# Patient Record
Sex: Female | Born: 1960 | Race: Black or African American | Hispanic: No | Marital: Single | Smoking: Never smoker
Health system: Southern US, Community
[De-identification: ages and names within clinical notes are randomized; demographics above are authoritative.]

## PROBLEM LIST (undated history)

## (undated) DIAGNOSIS — J45909 Unspecified asthma, uncomplicated: Secondary | ICD-10-CM

## (undated) DIAGNOSIS — E079 Disorder of thyroid, unspecified: Secondary | ICD-10-CM

## (undated) DIAGNOSIS — I10 Essential (primary) hypertension: Secondary | ICD-10-CM

## (undated) HISTORY — PX: NECK SURGERY: SHX720

## (undated) HISTORY — PX: BACK SURGERY: SHX140

## (undated) HISTORY — PX: CHOLECYSTECTOMY: SHX55

---

## 2004-07-05 ENCOUNTER — Emergency Department: Payer: Self-pay | Admitting: Emergency Medicine

## 2005-02-07 ENCOUNTER — Emergency Department: Payer: Self-pay | Admitting: Emergency Medicine

## 2005-05-01 ENCOUNTER — Emergency Department (HOSPITAL_COMMUNITY): Admission: EM | Admit: 2005-05-01 | Discharge: 2005-05-01 | Payer: Self-pay | Admitting: Emergency Medicine

## 2005-06-11 ENCOUNTER — Emergency Department (HOSPITAL_COMMUNITY): Admission: EM | Admit: 2005-06-11 | Discharge: 2005-06-11 | Payer: Self-pay | Admitting: Emergency Medicine

## 2006-07-08 ENCOUNTER — Observation Stay: Payer: Self-pay | Admitting: Certified Nurse Midwife

## 2006-07-16 ENCOUNTER — Observation Stay: Payer: Self-pay | Admitting: Obstetrics and Gynecology

## 2006-07-18 ENCOUNTER — Observation Stay: Payer: Self-pay | Admitting: Obstetrics and Gynecology

## 2006-07-25 ENCOUNTER — Observation Stay: Payer: Self-pay | Admitting: Certified Nurse Midwife

## 2006-08-02 ENCOUNTER — Inpatient Hospital Stay: Payer: Self-pay | Admitting: Obstetrics and Gynecology

## 2006-08-24 ENCOUNTER — Emergency Department: Payer: Self-pay | Admitting: Emergency Medicine

## 2006-11-03 ENCOUNTER — Ambulatory Visit: Payer: Self-pay | Admitting: Obstetrics and Gynecology

## 2006-11-11 ENCOUNTER — Ambulatory Visit: Payer: Self-pay | Admitting: Obstetrics and Gynecology

## 2007-01-04 ENCOUNTER — Emergency Department: Payer: Self-pay | Admitting: Emergency Medicine

## 2007-01-04 ENCOUNTER — Other Ambulatory Visit: Payer: Self-pay

## 2007-09-09 ENCOUNTER — Emergency Department: Payer: Self-pay | Admitting: Emergency Medicine

## 2008-03-08 ENCOUNTER — Emergency Department: Payer: Self-pay | Admitting: Emergency Medicine

## 2008-06-10 ENCOUNTER — Emergency Department: Payer: Self-pay | Admitting: Emergency Medicine

## 2008-10-20 ENCOUNTER — Emergency Department: Payer: Self-pay | Admitting: Unknown Physician Specialty

## 2009-07-08 ENCOUNTER — Emergency Department: Payer: Self-pay | Admitting: Emergency Medicine

## 2009-08-06 ENCOUNTER — Ambulatory Visit: Payer: Self-pay | Admitting: Orthopedic Surgery

## 2009-09-09 ENCOUNTER — Ambulatory Visit: Payer: Self-pay | Admitting: Family Medicine

## 2009-09-15 ENCOUNTER — Emergency Department: Payer: Self-pay | Admitting: Emergency Medicine

## 2009-11-09 ENCOUNTER — Emergency Department: Payer: Self-pay | Admitting: Internal Medicine

## 2010-06-07 ENCOUNTER — Emergency Department: Payer: Self-pay | Admitting: Emergency Medicine

## 2010-12-24 ENCOUNTER — Emergency Department: Payer: Self-pay | Admitting: Emergency Medicine

## 2011-05-23 ENCOUNTER — Emergency Department: Payer: Self-pay | Admitting: Emergency Medicine

## 2011-09-12 ENCOUNTER — Emergency Department: Payer: Self-pay | Admitting: *Deleted

## 2011-12-27 ENCOUNTER — Emergency Department: Payer: Self-pay | Admitting: Unknown Physician Specialty

## 2011-12-27 LAB — COMPREHENSIVE METABOLIC PANEL
Alkaline Phosphatase: 98 U/L (ref 50–136)
Bilirubin,Total: 0.2 mg/dL (ref 0.2–1.0)
Chloride: 104 mmol/L (ref 98–107)
Co2: 26 mmol/L (ref 21–32)
EGFR (Non-African Amer.): >60
Potassium: 4 mmol/L (ref 3.5–5.1)
SGOT(AST): 18 U/L (ref 15–37)
SGPT (ALT): 34 U/L
Sodium: 139 mmol/L (ref 136–145)
Total Protein: 8.3 g/dL — ABNORMAL HIGH (ref 6.4–8.2)

## 2011-12-27 LAB — CBC
HGB: 12.5 g/dL (ref 12.0–16.0)
MCHC: 32.4 g/dL (ref 32.0–36.0)
Platelet: 175 10*3/uL (ref 150–440)

## 2012-03-07 ENCOUNTER — Emergency Department: Payer: Self-pay | Admitting: Emergency Medicine

## 2012-05-17 ENCOUNTER — Ambulatory Visit: Payer: Self-pay | Admitting: Pain Medicine

## 2012-06-02 ENCOUNTER — Ambulatory Visit: Payer: Self-pay | Admitting: Pain Medicine

## 2013-02-22 ENCOUNTER — Emergency Department: Payer: Self-pay | Admitting: Emergency Medicine

## 2013-02-22 LAB — COMPREHENSIVE METABOLIC PANEL
Alkaline Phosphatase: 94 U/L (ref 50–136)
Anion Gap: 5 — ABNORMAL LOW (ref 7–16)
BUN: 13 mg/dL (ref 7–18)
Calcium, Total: 9.2 mg/dL (ref 8.5–10.1)
Chloride: 104 mmol/L (ref 98–107)
Co2: 30 mmol/L (ref 21–32)
Creatinine: 0.79 mg/dL (ref 0.60–1.30)
EGFR (African American): >60
Glucose: 97 mg/dL (ref 65–99)
Potassium: 3.7 mmol/L (ref 3.5–5.1)
SGOT(AST): 25 U/L (ref 15–37)
Total Protein: 7.4 g/dL (ref 6.4–8.2)

## 2013-02-22 LAB — CBC WITH DIFFERENTIAL/PLATELET
Basophil #: 0.1 10*3/uL (ref 0.0–0.1)
Basophil %: 0.8 %
Eosinophil #: 0.2 10*3/uL (ref 0.0–0.7)
Eosinophil %: 1.9 %
HCT: 38 % (ref 35.0–47.0)
Lymphocyte #: 2.4 10*3/uL (ref 1.0–3.6)
Lymphocyte %: 27.2 %
MCH: 30.6 pg (ref 26.0–34.0)
Monocyte #: 0.6 x10 3/mm (ref 0.2–0.9)
Monocyte %: 6.6 %
Neutrophil #: 5.7 10*3/uL (ref 1.4–6.5)
Neutrophil %: 63.5 %
RBC: 4.28 10*6/uL (ref 3.80–5.20)
RDW: 14.4 % (ref 11.5–14.5)

## 2013-02-22 LAB — URINALYSIS, COMPLETE
Blood: NEGATIVE
Glucose,UR: NEGATIVE mg/dL (ref 0–75)
Ketone: NEGATIVE
Nitrite: NEGATIVE
Ph: 6 (ref 4.5–8.0)
Protein: 30
Specific Gravity: 1.016 (ref 1.003–1.030)
Squamous Epithelial: 2
WBC UR: 1 /HPF (ref 0–5)

## 2013-08-04 ENCOUNTER — Emergency Department: Payer: Self-pay | Admitting: Emergency Medicine

## 2013-08-17 ENCOUNTER — Ambulatory Visit: Payer: Self-pay | Admitting: Family Medicine

## 2013-09-05 ENCOUNTER — Emergency Department: Payer: Self-pay | Admitting: Emergency Medicine

## 2014-03-12 ENCOUNTER — Encounter: Payer: Self-pay | Admitting: Neurosurgery

## 2014-03-26 ENCOUNTER — Encounter: Payer: Self-pay | Admitting: Neurosurgery

## 2014-03-31 ENCOUNTER — Emergency Department: Payer: Self-pay | Admitting: Emergency Medicine

## 2014-03-31 LAB — CBC
HCT: 38.7 % (ref 35.0–47.0)
HGB: 12.7 g/dL (ref 12.0–16.0)
MCH: 29.8 pg (ref 26.0–34.0)
MCHC: 32.9 g/dL (ref 32.0–36.0)
MCV: 91 fL (ref 80–100)
PLATELETS: 176 10*3/uL (ref 150–440)
RBC: 4.26 10*6/uL (ref 3.80–5.20)
RDW: 14.1 % (ref 11.5–14.5)
WBC: 7 10*3/uL (ref 3.6–11.0)

## 2014-03-31 LAB — BASIC METABOLIC PANEL
Anion Gap: 6 — ABNORMAL LOW (ref 7–16)
BUN: 14 mg/dL (ref 7–18)
CHLORIDE: 102 mmol/L (ref 98–107)
CO2: 32 mmol/L (ref 21–32)
CREATININE: 0.89 mg/dL (ref 0.60–1.30)
Calcium, Total: 8.8 mg/dL (ref 8.5–10.1)
EGFR (African American): >60
EGFR (Non-African Amer.): >60
Glucose: 101 mg/dL — ABNORMAL HIGH (ref 65–99)
Osmolality: 280 (ref 275–301)
Potassium: 3.6 mmol/L (ref 3.5–5.1)
SODIUM: 140 mmol/L (ref 136–145)

## 2014-03-31 LAB — TROPONIN I: Troponin-I: <0.02 ng/mL

## 2014-04-01 LAB — TROPONIN I: Troponin-I: <0.02 ng/mL

## 2014-09-27 ENCOUNTER — Emergency Department: Payer: Self-pay | Admitting: Emergency Medicine

## 2014-10-03 ENCOUNTER — Emergency Department: Payer: Self-pay | Admitting: Emergency Medicine

## 2015-09-23 ENCOUNTER — Encounter: Payer: Self-pay | Admitting: Medical Oncology

## 2015-09-23 ENCOUNTER — Emergency Department
Admission: EM | Admit: 2015-09-23 | Discharge: 2015-09-23 | Disposition: A | Discharge status: Home / Self Care | Payer: Medicare HMO | Attending: Emergency Medicine | Admitting: Emergency Medicine

## 2015-09-23 ENCOUNTER — Emergency Department: Payer: Medicare HMO

## 2015-09-23 DIAGNOSIS — Z79899 Other long term (current) drug therapy: Secondary | ICD-10-CM | POA: Insufficient documentation

## 2015-09-23 DIAGNOSIS — I10 Essential (primary) hypertension: Secondary | ICD-10-CM | POA: Diagnosis not present

## 2015-09-23 DIAGNOSIS — N644 Mastodynia: Secondary | ICD-10-CM

## 2015-09-23 DIAGNOSIS — R200 Anesthesia of skin: Secondary | ICD-10-CM | POA: Diagnosis not present

## 2015-09-23 DIAGNOSIS — M25519 Pain in unspecified shoulder: Secondary | ICD-10-CM | POA: Insufficient documentation

## 2015-09-23 DIAGNOSIS — M79602 Pain in left arm: Secondary | ICD-10-CM | POA: Diagnosis present

## 2015-09-23 HISTORY — DX: Essential (primary) hypertension: I10

## 2015-09-23 LAB — COMPREHENSIVE METABOLIC PANEL
ALBUMIN: 4 g/dL (ref 3.5–5.0)
ALT: 23 U/L (ref 14–54)
ANION GAP: 7 (ref 5–15)
AST: 22 U/L (ref 15–41)
Alkaline Phosphatase: 76 U/L (ref 38–126)
BILIRUBIN TOTAL: 0.3 mg/dL (ref 0.3–1.2)
BUN: 13 mg/dL (ref 6–20)
CHLORIDE: 105 mmol/L (ref 101–111)
CO2: 27 mmol/L (ref 22–32)
Calcium: 9.4 mg/dL (ref 8.9–10.3)
Creatinine, Ser: 0.83 mg/dL (ref 0.44–1.00)
GFR calc Af Amer: >60 mL/min (ref >60)
GFR calc non Af Amer: >60 mL/min (ref >60)
GLUCOSE: 105 mg/dL — AB (ref 65–99)
POTASSIUM: 4.2 mmol/L (ref 3.5–5.1)
SODIUM: 139 mmol/L (ref 135–145)
TOTAL PROTEIN: 7.7 g/dL (ref 6.5–8.1)

## 2015-09-23 LAB — CBC WITH DIFFERENTIAL/PLATELET
BASOS ABS: 0.1 10*3/uL (ref 0–0.1)
BASOS PCT: 1 %
Eosinophils Absolute: 0.1 10*3/uL (ref 0–0.7)
Eosinophils Relative: 2 %
HEMATOCRIT: 36.2 % (ref 35.0–47.0)
HEMOGLOBIN: 12 g/dL (ref 12.0–16.0)
LYMPHS PCT: 29 %
Lymphs Abs: 1.9 10*3/uL (ref 1.0–3.6)
MCH: 29.1 pg (ref 26.0–34.0)
MCHC: 33 g/dL (ref 32.0–36.0)
MCV: 88.1 fL (ref 80.0–100.0)
MONOS PCT: 7 %
Monocytes Absolute: 0.5 10*3/uL (ref 0.2–0.9)
NEUTROS ABS: 4.1 10*3/uL (ref 1.4–6.5)
NEUTROS PCT: 61 %
Platelets: 186 10*3/uL (ref 150–440)
RBC: 4.12 MIL/uL (ref 3.80–5.20)
RDW: 14.4 % (ref 11.5–14.5)
WBC: 6.7 10*3/uL (ref 3.6–11.0)

## 2015-09-23 LAB — TROPONIN I: Troponin I: <0.03 ng/mL (ref <0.031)

## 2015-09-23 LAB — FIBRIN DERIVATIVES D-DIMER (ARMC ONLY): Fibrin derivatives D-dimer (ARMC): 656 — ABNORMAL HIGH (ref 0–499)

## 2015-09-23 MED ORDER — IOHEXOL 350 MG/ML SOLN
100.0000 mL | Freq: Once | INTRAVENOUS | Status: AC | PRN
  Administered 2015-09-23: 100 mL via INTRAVENOUS

## 2015-09-23 NOTE — ED Notes (Signed)
Pt returned from ct scan

## 2015-09-23 NOTE — ED Notes (Signed)
Pt reports that she started Sunday with left arm pain and numbness, since then pt reports she has began to feel like her heart is racing and that she is sob. Pt also reports pain and a "knot" feeling to left breast since Sunday, painful to touch.

## 2015-09-23 NOTE — ED Provider Notes (Signed)
[facility] Emergency Department Provider Note  ____________________________________________  Time seen: Approximately 1:16 PM  I have reviewed the triage vital signs and the nursing notes.   HISTORY  Chief Complaint Arm Pain; Chest Pain; and Numbness    HPI Elizabeth [name] is a 55 y.o. female patient reports she's had pain in her left arm and numbness radiating from the neck down along the outside of the left arm past the elbow toward the hand in a stripe-like area since about Sunday. It's been numb and tingly in that area. Patient also has been having pain in her shoulder since approximately that time as well. Hurts to move her shoulder. Patient has also been having her heart race off and on especially this morning with some shortness of breath. She's been having some pain in her left side of her chest especially in the left breast area to palpation and with movement of her arm. Patient has not had problems like this before. 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 Patient reports the pain is moderate to severe. There is fairly sharp and achy.   Past Medical History  Diagnosis Date  . Hypertension     There are no active problems to display for this patient.   Past Surgical History  Procedure Laterality Date  . Back surgery    . Neck surgery    . Cholecystectomy      Current Outpatient Rx  Name  Route  Sig  Dispense  Refill  . albuterol (VENTOLIN HFA) 108 (90 Base) MCG/ACT inhaler   Inhalation   Inhale 1-2 puffs into the lungs every 6 (six) hours as needed.         Marland Kitchen amLODipine (NORVASC) 10 MG tablet   Oral   Take 1 tablet by mouth daily.         Marland Kitchen atenolol (TENORMIN) 25 MG tablet   Oral   Take 1 tablet by mouth daily.         . Cholecalciferol (D 2000) 2000 units TABS   Oral   Take 1 tablet by mouth daily.         . fluticasone (FLONASE) 50 MCG/ACT  nasal spray   Each Nare   Place 2 sprays into both nostrils as needed.         . hydrochlorothiazide (HYDRODIURIL) 25 MG tablet   Oral   Take 1 tablet by mouth daily.         Marland Kitchen losartan (COZAAR) 50 MG tablet   Oral   Take 50 mg by mouth daily.         . ranitidine (ZANTAC) 150 MG tablet   Oral   Take 150 mg by mouth as needed for heartburn.           Allergies Lisinopril  No family history on file.  Social History Social History  Substance Use Topics  . Smoking status: Never Smoker   . Smokeless tobacco: None  . Alcohol Use: None    Review of Systems Constitutional: No fever/chills Eyes: No visual changes. ENT: No sore throat. Cardiovascular: See history of present illness Respiratory: See history of present illness Gastrointestinal: No abdominal pain.  No nausea, no vomiting.  No diarrhea.  No constipation. Genitourinary: Negative for dysuria. Musculoskeletal: Negative for back pain. Skin: Negative for rash. Neurological: Negative for headaches, focal weakness or numbness.  10-point ROS otherwise negative.  ____________________________________________   PHYSICAL  EXAM:  VITAL SIGNS: ED Triage Vitals  Enc Vitals Group     BP 09/23/15 0923 170/98 mmHg     Pulse Rate 09/23/15 0923 68     Resp 09/23/15 0923 18     Temp 09/23/15 0923 98.1 F (36.7 C)     Temp Source 09/23/15 0923 Oral     SpO2 09/23/15 0923 99 %     Weight 09/23/15 0923 218 lb (98.884 kg)     Height 09/23/15 0923  (1.575 m)     Head Cir --      Peak Flow --      Pain Score 09/23/15 0924 7     Pain Loc --      Pain Edu? --      Excl. in GC? --     Constitutional: Alert and oriented. Well appearing and in no acute distress. Eyes: Conjunctivae are normal. PERRL. EOMI. Head: Atraumatic. Nose: No congestion/rhinnorhea. Mouth/Throat: Mucous membranes are moist.  Oropharynx non-erythematous. Neck: No stridor.   Cardiovascular: Normal rate, regular rhythm. Grossly normal  heart sounds.  Good peripheral circulation. Respiratory: Normal respiratory effort.  No retractions. Lungs CTAB. Patient does have some tenderness especially on palpation of the left breast which was done with the PA student in the room Gastrointestinal: Soft and nontender. No distention. No abdominal bruits. No CVA tenderness. Musculoskeletal: No lower extremity tenderness nor edema.  No joint effusions. Neurologic:  Normal speech and language. Patient has numbness in the lateral part of the arm from above the shoulder down to below the elbow in a dermatomal pattern is no motor weakness. Skin:  Skin is warm, dry and intact. No rash noted. Psychiatric: Mood and affect are normal. Speech and behavior are normal.  ____________________________________________   LABS (all labs ordered are listed, but only abnormal results are displayed)  Labs Reviewed  COMPREHENSIVE METABOLIC PANEL - Abnormal; Notable for the following:    Glucose, Bld 105 (*)    All other components within normal limits  FIBRIN DERIVATIVES D-DIMER ([facility] ONLY) - Abnormal; Notable for the following:    Fibrin derivatives D-dimer ([facility]) 656 (*)    All other components within normal limits  TROPONIN I  CBC WITH DIFFERENTIAL/PLATELET   ____________________________________________  EKG  EKG read and interpreted by me shows normal sinus rhythm rate of 68 normal axis essentially normal EKG ____________________________________________  RADIOLOGY  Chest x-ray read by radiology as no acute disease. Shoulder x-ray read by radiology as calcific tendinitis. C-spine read by radiology as possible loosening of the hardware. Radiology recommends CT of the neck. CT per radiology shows some loosening at C3 and C4. There is also some impingement on the neuroforamen at 2 levels. I discussed this with Vernona Rieger Dr. Sarita Haver [facility] nurse. She will arrange follow-up for the patient. CT of the chest is negative.  ____________________________________________   PROCEDURES    ____________________________________________   INITIAL IMPRESSION / ASSESSMENT AND PLAN / ED COURSE  Pertinent labs & imaging results that were available during my care of the patient were reviewed by me and considered in my medical decision making (see chart for details).  D-dimer is elevated I will do a CT of the chest as well. ____________________________________________   FINAL CLINICAL IMPRESSION(S) / ED DIAGNOSES  Final diagnoses:  Shoulder pain, unspecified laterality  Breast pain      Arnaldo Natal, MD 09/23/15 1732

## 2015-09-23 NOTE — Discharge Instructions (Signed)
I discussed the CT of the neck with Dr. Samuella Cota oh and a [facility] nurse. She says they will call you and arrange follow-up for you. However if they do not call you by Thursday please call the office at [phone] to arrange a follow-up appointment. Please also have your primary care doctor follow-up with your breast pain. I think that a repeat mammogram may be a good idea. You can use Tylenol or Motrin for the pain for the time being. Please return to the emergency room for worse pain or any weakness. Breast Tenderness Breast tenderness is a common problem for women of all ages. Breast tenderness may cause mild discomfort to severe pain. It has a variety of causes. Your health care provider will find out the likely cause of your breast tenderness by examining your breasts, asking you about symptoms, and ordering some tests. Breast tenderness usually does not mean you have breast cancer. HOME CARE INSTRUCTIONS  Breast tenderness often can be handled at home. You can try:  Getting fitted for a new bra that provides more support, especially during exercise.  Wearing a more supportive bra or sports bra while sleeping when your breasts are very tender.  If you have a breast injury, apply ice to the area:  Put ice in a plastic bag.  Place a towel between your skin and the bag.  Leave the ice on for 20 minutes, 2-3 times a day.  If your breasts are too full of milk as a result of breastfeeding, try:  Expressing milk either by hand or with a breast pump.  Applying a warm compress to the breasts for relief.  Taking over-the-counter pain relievers, if approved by your health care provider.  Taking other medicines that your health care provider prescribes. These may include antibiotic medicines or birth control pills. Over the long term, your breast tenderness might be eased if you:  Cut down on caffeine.  Reduce the amount of fat in your diet. Keep a log of the days and times when your breasts are  most tender. This will help you and your health care provider find the cause of the tenderness and how to relieve it. Also, learn how to do breast exams at home. This will help you notice if you have an unusual growth or lump that could cause tenderness. SEEK MEDICAL CARE IF:   Any part of your breast is hard, red, and hot to the touch. This could be a sign of infection.  Fluid is coming out of your nipples (and you are not breastfeeding). Especially watch for blood or pus.  You have a fever as well as breast tenderness.  You have a new or painful lump in your breast that remains after your menstrual period ends.  You have tried to take care of the pain at home, but it has not gone away.  Your breast pain is getting worse, or the pain is making it hard to do the things you usually do during your day.   This information is not intended to replace advice given to you by your health care provider. Make sure you discuss any questions you have with your health care provider.   Document Released: 06/24/2008 Document Revised: 03/14/2013 Document Reviewed: 02/08/2013 Elsevier Interactive Patient Education [employer]

## 2016-06-22 ENCOUNTER — Other Ambulatory Visit: Payer: Self-pay | Admitting: Obstetrics and Gynecology

## 2016-06-22 DIAGNOSIS — I739 Peripheral vascular disease, unspecified: Secondary | ICD-10-CM

## 2016-06-25 ENCOUNTER — Ambulatory Visit
Admission: RE | Admit: 2016-06-25 | Discharge: 2016-06-25 | Disposition: A | Discharge status: Home / Self Care | Payer: Medicare HMO | Source: Ambulatory Visit | Attending: Obstetrics and Gynecology | Admitting: Obstetrics and Gynecology

## 2016-06-25 DIAGNOSIS — I739 Peripheral vascular disease, unspecified: Secondary | ICD-10-CM | POA: Insufficient documentation

## 2016-07-13 ENCOUNTER — Ambulatory Visit (INDEPENDENT_AMBULATORY_CARE_PROVIDER_SITE_OTHER): Payer: Medicare HMO | Admitting: Vascular Surgery

## 2016-07-13 ENCOUNTER — Encounter (INDEPENDENT_AMBULATORY_CARE_PROVIDER_SITE_OTHER): Payer: Self-pay | Admitting: Vascular Surgery

## 2016-07-13 VITALS — BP 150/93 | HR 67 | Resp 16 | Ht 62.0 in | Wt 217.0 lb

## 2016-07-13 DIAGNOSIS — M79604 Pain in right leg: Secondary | ICD-10-CM

## 2016-07-13 DIAGNOSIS — M5440 Lumbago with sciatica, unspecified side: Secondary | ICD-10-CM

## 2016-07-13 DIAGNOSIS — M79609 Pain in unspecified limb: Secondary | ICD-10-CM | POA: Insufficient documentation

## 2016-07-13 DIAGNOSIS — I10 Essential (primary) hypertension: Secondary | ICD-10-CM

## 2016-07-13 DIAGNOSIS — M79605 Pain in left leg: Secondary | ICD-10-CM | POA: Diagnosis not present

## 2016-07-13 DIAGNOSIS — M545 Low back pain, unspecified: Secondary | ICD-10-CM | POA: Insufficient documentation

## 2016-07-13 NOTE — Assessment & Plan Note (Signed)
The patient had lower extremity pain that was not entirely clear in its etiology. Given the character of the pain, arterial insufficiency was very reasonable to be considered. She had noninvasive studies which demonstrated no significant lower extremity arterial stenosis bilaterally with only scant atherosclerotic plaque present. It does not appear as if this is the cause of her lower extremity symptoms. Neurogenic claudication is more likely the cause. No further evaluation or workup from a vascular perspective is warranted at this time. I will see the patient back on an as-needed basis.

## 2016-07-13 NOTE — Progress Notes (Signed)
Patient ID: Elizabeth [name], female   DOB: 1961-01-08, 55 y.o.   MRN: [id number]  Chief Complaint  Patient presents with  . New Patient (Initial Visit)    HPI Elizabeth [name] is a 55 y.o. female.  I am asked to see the patient by Dr. Karleen Hampshire for evaluation of PAD as a possible cause of leg pain.  The patient reports lower extremity pain and weakness with activity. This has been going on for months without a clear inciting event or causative factor. She reports no trauma or injury or clear cause of the symptoms. She denies fever or chills. She had a procedure on her back but this did not help her lower extremity symptoms much. both lower extremities are affected.  She had noninvasive studies which demonstrated no significant lower extremity arterial stenosis bilaterally with only scant atherosclerotic plaque present   Past Medical History:  Diagnosis Date  . Hypertension     Past Surgical History:  Procedure Laterality Date  . BACK SURGERY    . CHOLECYSTECTOMY    . NECK SURGERY      Family History  Problem Relation Age of Onset  . Congestive Heart Failure Mother   . Diabetes Mother   . Heart attack Mother   . Hypertension Mother   . Stroke Mother   . Emphysema Father   . COPD Father   . Hypertension Father     Social History Social History  Substance Use Topics  . Smoking status: Never Smoker  . Smokeless tobacco: Never Used  . Alcohol use No    Allergies  Allergen Reactions  . Lisinopril     Current Outpatient Prescriptions  Medication Sig Dispense Refill  . albuterol (VENTOLIN HFA) 108 (90 Base) MCG/ACT inhaler Inhale 1-2 puffs into the lungs every 6 (six) hours as needed.    Marland Kitchen atenolol (TENORMIN) 25 MG tablet Take 1 tablet by mouth daily.    . cetirizine (ZYRTEC) 5 MG tablet Take 5 mg by mouth daily.    . Cholecalciferol (D 2000) 2000 units TABS Take 1 tablet by mouth daily.    . fluticasone (FLONASE) 50 MCG/ACT nasal spray Place 2 sprays into both nostrils  as needed.    . hydrochlorothiazide (HYDRODIURIL) 25 MG tablet Take 1 tablet by mouth daily.    Marland Kitchen losartan (COZAAR) 50 MG tablet Take 50 mg by mouth daily.    . ranitidine (ZANTAC) 150 MG tablet Take 150 mg by mouth as needed for heartburn.    Marland Kitchen amLODipine (NORVASC) 10 MG tablet Take 1 tablet by mouth daily.     No current facility-administered medications for this visit.       REVIEW OF SYSTEMS (Negative unless checked)  Constitutional: [] Weight loss  [] Fever  [] Chills Cardiac: [] Chest pain   [] Chest pressure   [] Palpitations   [] Shortness of breath when laying flat   [] Shortness of breath at rest   [] Shortness of breath with exertion. Vascular:  [x] Pain in legs with walking   [] Pain in legs at rest   [] Pain in legs when laying flat   [x] Claudication   [] Pain in feet when walking  [] Pain in feet at rest  [] Pain in feet when laying flat   [] History of DVT   [] Phlebitis   [] Swelling in legs   [] Varicose veins   [] Non-healing ulcers Pulmonary:   [] Uses home oxygen   [] Productive cough   [] Hemoptysis   [] Wheeze  [] COPD   [] Asthma Neurologic:  [] Dizziness  [] Blackouts   []   Seizures   [] History of stroke   [] History of TIA  [] Aphasia   [] Temporary blindness   [] Dysphagia   [] Weakness or numbness in arms   [] Weakness or numbness in legs Musculoskeletal:  [] Arthritis   [] Joint swelling   [] Joint pain   [x] Low back pain Hematologic:  [] Easy bruising  [] Easy bleeding   [] Hypercoagulable state   [] Anemic  [] Hepatitis Gastrointestinal:  [] Blood in stool   [] Vomiting blood  [] Gastroesophageal reflux/heartburn   [] Abdominal pain Genitourinary:  [] Chronic kidney disease   [] Difficult urination  [] Frequent urination  [] Burning with urination   [] Hematuria Skin:  [] Rashes   [] Ulcers   [] Wounds Psychological:  [] History of anxiety   []  History of major depression.    Physical Exam BP (!) 150/93   Pulse 67   Resp 16   Ht 5\' 2"  (1.575 m)   Wt 217 lb (98.4 kg)   BMI 39.69 kg/m  Gen:  WD/WN, NAD Head:  [place]/AT, No temporalis wasting. Prominent temp pulse not noted. Ear/Nose/Throat: Hearing grossly intact, nares w/o erythema or drainage, oropharynx w/o Erythema/Exudate Eyes: Conjunctiva clear, sclera non-icteric  Neck: trachea midline.  No JVD.  Pulmonary:  Good air movement, no use of accessory muscles Cardiac: RRR, normal S1, S2 Vascular:  Vessel Right Left  Radial Palpable Palpable  Ulnar Palpable Palpable  Brachial Palpable Palpable  Carotid Palpable, without bruit Palpable, without bruit  Aorta Not palpable N/A  Femoral Palpable Palpable  Popliteal Palpable Palpable  PT Palpable 1+ Palpable  DP Palpable Palpable   Gastrointestinal: soft, non-tender/non-distended. No guarding/reflex. No masses, surgical incisions, or scars. Musculoskeletal: M/S 5/5 throughout.  Extremities without ischemic changes.  No deformity or atrophy. Neurologic: Sensation grossly intact in extremities.  Symmetrical.  Speech is fluent. Motor exam as listed above. Psychiatric: Judgment intact, Mood & affect appropriate for pt's clinical situation. Dermatologic: No rashes or ulcers noted.  No cellulitis or open wounds. Lymph : No Cervical, Axillary, or Inguinal lymphadenopathy.   Radiology [place] Lower Ext Art Bilat  Result Date: 06/26/2016 CLINICAL DATA:  55 year old female with calf claudication EXAM: BILATERAL LOWER EXTREMITY ARTERIAL DUPLEX SCAN TECHNIQUE: Gray-scale sonography as well as color Doppler and duplex ultrasound was performed to evaluate the arteries of both lower extremities. COMPARISON:  None. FINDINGS: Right lower extremity: Normal triphasic arterial waveforms throughout the right lower extremity. There is mild focal calcified plaque in the right common femoral artery but no evidence of stenosis. Peak systolic velocities remain normal throughout the lower extremity. Arterial spectral waveforms are also within normal limits. Left lower extremity: Normal triphasic arterial waveforms throughout the  left lower extremity. Peak systolic velocities remain normal throughout. Arterial spectral waveforms are also within normal limits. IMPRESSION: 1. No evidence of hemodynamically significant peripheral arterial disease in either lower extremity. 2. There is trace mild calcified plaque in the right common femoral artery without evidence of stenosis. Signed, Sterling BigHeath K. McCullough, MD [facility] Electronically Signed   By: Malachy MoanHeath  McCullough M.D.   On: 06/26/2016 10:34    Labs No results found for this or any previous visit (from the past 2160 hour(s)).  Assessment/Plan:  Low back pain Had back surgery but legs still bothering her  Essential hypertension, benign blood pressure control important in reducing the progression of atherosclerotic disease. On appropriate oral medications.   Pain in limb The patient had lower extremity pain that was not entirely clear in its etiology. Given the character of the pain, arterial insufficiency was very reasonable to be  considered. She had noninvasive studies which demonstrated no significant lower extremity arterial stenosis bilaterally with only scant atherosclerotic plaque present. It does not appear as if this is the cause of her lower extremity symptoms. Neurogenic claudication is more likely the cause. No further evaluation or workup from a vascular perspective is warranted at this time. I will see the patient back on an as-needed basis.      Festus BarrenJason Dew 07/13/2016, 11:50 AM   This note was created with Dragon medical transcription system.  Any errors from dictation are unintentional.

## 2016-07-13 NOTE — Assessment & Plan Note (Signed)
blood pressure control important in reducing the progression of atherosclerotic disease. On appropriate oral medications.  

## 2016-07-13 NOTE — Assessment & Plan Note (Signed)
Had back surgery but legs still bothering her

## 2016-11-25 ENCOUNTER — Emergency Department
Admission: EM | Admit: 2016-11-25 | Discharge: 2016-11-25 | Disposition: A | Discharge status: Home / Self Care | Payer: Medicare HMO | Attending: Emergency Medicine | Admitting: Emergency Medicine

## 2016-11-25 ENCOUNTER — Emergency Department: Payer: Medicare HMO

## 2016-11-25 ENCOUNTER — Encounter: Payer: Self-pay | Admitting: Intensive Care

## 2016-11-25 DIAGNOSIS — Z79899 Other long term (current) drug therapy: Secondary | ICD-10-CM | POA: Insufficient documentation

## 2016-11-25 DIAGNOSIS — R002 Palpitations: Secondary | ICD-10-CM | POA: Diagnosis not present

## 2016-11-25 DIAGNOSIS — R079 Chest pain, unspecified: Secondary | ICD-10-CM | POA: Diagnosis present

## 2016-11-25 DIAGNOSIS — I10 Essential (primary) hypertension: Secondary | ICD-10-CM | POA: Diagnosis not present

## 2016-11-25 DIAGNOSIS — R0789 Other chest pain: Secondary | ICD-10-CM | POA: Diagnosis not present

## 2016-11-25 LAB — CBC
HEMATOCRIT: 35.2 % (ref 35.0–47.0)
Hemoglobin: 11.8 g/dL — ABNORMAL LOW (ref 12.0–16.0)
MCH: 28.6 pg (ref 26.0–34.0)
MCHC: 33.6 g/dL (ref 32.0–36.0)
MCV: 85.2 fL (ref 80.0–100.0)
Platelets: 189 10*3/uL (ref 150–440)
RBC: 4.13 MIL/uL (ref 3.80–5.20)
RDW: 13.4 % (ref 11.5–14.5)
WBC: 5.7 10*3/uL (ref 3.6–11.0)

## 2016-11-25 LAB — BASIC METABOLIC PANEL
Anion gap: 6 (ref 5–15)
BUN: 10 mg/dL (ref 6–20)
CALCIUM: 9.5 mg/dL (ref 8.9–10.3)
CO2: 26 mmol/L (ref 22–32)
Chloride: 106 mmol/L (ref 101–111)
Creatinine, Ser: 0.56 mg/dL (ref 0.44–1.00)
GFR calc Af Amer: >60 mL/min (ref >60)
GFR calc non Af Amer: >60 mL/min (ref >60)
GLUCOSE: 110 mg/dL — AB (ref 65–99)
Potassium: 3.5 mmol/L (ref 3.5–5.1)
Sodium: 138 mmol/L (ref 135–145)

## 2016-11-25 LAB — TROPONIN I: Troponin I: <0.03 ng/mL (ref <0.03)

## 2016-11-25 LAB — TSH

## 2016-11-25 MED ORDER — KETOROLAC TROMETHAMINE 30 MG/ML IJ SOLN: 30.0000 mg | Freq: Once | INTRAMUSCULAR | Status: DC

## 2016-11-25 MED ORDER — KETOROLAC TROMETHAMINE 60 MG/2ML IM SOLN
60.0000 mg | Freq: Once | INTRAMUSCULAR | Status: AC
  Administered 2016-11-25: 60 mg via INTRAMUSCULAR
  Filled 2016-11-25: qty 2

## 2016-11-25 NOTE — ED Triage Notes (Signed)
Patient presents with sharp chest pain that started Tuesday under her L breast that Radiates to L shoulder and back. Pt reports the sharp pain is intermittent. A&O X4 in triage. Ambulatory in triage with grimace to face when moving.

## 2016-11-25 NOTE — ED Provider Notes (Signed)
[facility] Emergency Department Provider Note  ____________________________________________  Time seen: Approximately 11:45 AM  I have reviewed the triage vital signs and the nursing notes.   HISTORY  Chief Complaint Chest Pain    HPI Elizabeth [name] is a 56 y.o. female w/ a hx of HTN presenting for positional chest pain.  The patient underwent colonoscopy morning 5/1, and later that day she was sitting at home when she began to notice a sharp pain just above her left breast radiating to the back and the left upper extremity that only occurred with positional changes. She did not have associated shortness breath, palpitations, lightheadedness, n/v, diaphoresis or syncope.  She has continued to have this pain since then.  The patient has not tried any medications for her pain. Distally, the patient has had several months of intermittent short episodes of "heart racing," which appear to be unrelated to her symptoms today. Last stress test 2007; no hx of cath.  SH: no tobacco or cocaine FH: CAD   Past Medical History:  Diagnosis Date  . Hypertension     Patient Active Problem List   Diagnosis Date Noted  . Low back pain 07/13/2016  . Essential hypertension, benign 07/13/2016  . Pain in limb 07/13/2016    Past Surgical History:  Procedure Laterality Date  . BACK SURGERY    . CHOLECYSTECTOMY    . NECK SURGERY      Current Outpatient Rx  . Order #: [id number] Class: Historical Med  . Order #: [id number] Class: Historical Med  . Order #: [id number] Class: Historical Med  . Order #: [id number] Class: Historical Med  . Order #: [id number] Class: Historical Med  . Order #: [id number] Class: Historical Med  . Order #: [id number] Class: Historical Med  . Order #: [id number] Class: Historical Med  . Order #: [id number] Class: Historical Med    Allergies Lisinopril  Family History  Problem Relation Age of Onset  . Congestive Heart Failure Mother   . Diabetes Mother    . Heart attack Mother   . Hypertension Mother   . Stroke Mother   . Emphysema Father   . COPD Father   . Hypertension Father     Social History Social History  Substance Use Topics  . Smoking status: Never Smoker  . Smokeless tobacco: Never Used  . Alcohol use No    Review of Systems Constitutional: No fever/chills. No lightheadedness or syncope. Eyes: No visual changes. ENT: No sore throat. No congestion or rhinorrhea. Cardiovascular: Positive positional chest pain. Denies palpitations. Respiratory: Denies shortness of breath.  No cough. Gastrointestinal: No abdominal pain.  No nausea, no vomiting.  No diarrhea.  No constipation. Genitourinary: Negative for dysuria. Musculoskeletal: Negative for back pain. No lower extremity swelling or calf pain. Skin: Negative for rash. Neurological: Negative for headaches. No focal numbness, tingling or weakness.   10-point ROS otherwise negative.  ____________________________________________   PHYSICAL EXAM:  VITAL SIGNS: ED Triage Vitals  Enc Vitals Group     BP 11/25/16 1129 (!) 166/81     Pulse Rate 11/25/16 1129 81     Resp 11/25/16 1129 18     Temp 11/25/16 1129 98.5 F (36.9 C)     Temp Source 11/25/16 1129 Oral     SpO2 --      Weight 11/25/16 1127 215 lb (97.5 kg)     Height 11/25/16 1127 5\' 2"  (1.575 m)     Head Circumference --      Peak Flow --  Pain Score 11/25/16 1127 10     Pain Loc --      Pain Edu? --      Excl. in GC? --     Constitutional: Alert and oriented. Well appearing and in no acute distress. Answers questions appropriately. Eyes: Conjunctivae are normal.  EOMI. No scleral icterus. Head: Atraumatic. Nose: No congestion/rhinnorhea. Mouth/Throat: Mucous membranes are moist.  Neck: No stridor.  Supple.  No JVD. Cardiovascular: Normal rate, regular rhythm. No murmurs, rubs or gallops. On my examination, the patient's chest pain is not reproducible with palpation, but any movement of the  chest wall does reproduce her pain. Respiratory: Normal respiratory effort.  No accessory muscle use or retractions. Lungs CTAB.  No wheezes, rales or ronchi. Gastrointestinal: Soft, nontender and nondistended.  No guarding or rebound.  No peritoneal signs. Musculoskeletal: No LE edema. No ttp in the calves or palpable cords.  Negative Homan's sign. Neurologic:  A&Ox3.  Speech is clear.  Face and smile are symmetric.  EOMI.  Moves all extremities well. Skin:  Skin is warm, dry and intact. No rash noted. Psychiatric: Mood and affect are normal. Speech and behavior are normal.  Normal judgement.  ____________________________________________   LABS (all labs ordered are listed, but only abnormal results are displayed)  Labs Reviewed  BASIC METABOLIC PANEL - Abnormal; Notable for the following:       Result Value   Glucose, Bld 110 (*)    All other components within normal limits  CBC - Abnormal; Notable for the following:    Hemoglobin 11.8 (*)    All other components within normal limits  TROPONIN I  TSH   ____________________________________________  EKG  ED ECG REPORT I, Rockne Menghini, the attending physician, personally viewed and interpreted this ECG.   Date: 11/25/2016  EKG Time: 1126  Rate: 81  Rhythm: normal sinus rhythm  Axis: normal  Intervals:none  ST&T Change: No STEMI  ____________________________________________  RADIOLOGY  Dg Chest 2 View  Result Date: 11/25/2016 CLINICAL DATA:  Pain under the left breast since Tuesday. EXAM: CHEST  2 VIEW COMPARISON:  09/23/2015 FINDINGS: Normal heart size and mediastinal contours. No acute infiltrate or edema. No effusion or pneumothorax. Spondylosis throughout the thoracic spine. Status post cervical ACDF. Apparent irregularity involving the lateral left seventh rib is likely overlapping osseous structures. No suspected fracture. IMPRESSION: Stable exam.  No evidence of acute disease. Electronically Signed   By:  Marnee Spring M.D.   On: 11/25/2016 12:03    ____________________________________________   PROCEDURES  Procedure(s) performed: None  Procedures  Critical Care performed: No ____________________________________________   INITIAL IMPRESSION / ASSESSMENT AND PLAN / ED COURSE  Pertinent labs & imaging results that were available during my care of the patient were reviewed by me and considered in my medical decision making (see chart for details).  56 y.o. F w/ HTN presenting w/ positional chest pain after colonoscopy two days ago.  Clinically, the patient's symptoms are most consistent with a musculoskeletal strain, likely from the procedure 2 days ago. However, she does have some risk factors for CAD and has not had any recent risk stratification studies. We will plan to treat her symptomatically, but if her workup in the emergency department is negative, I will plan to have her follow up for an outpatient stress test. Plan reevaluation for final disposition.  ----------------------------------------- 12:52 PM on 11/25/2016 -----------------------------------------  The patient's workup in the emergency department is reassuring. The cardiologist on-call will see the patient for an  outpatient stress test. The patient is stable for discharge at this time, return precautions as well as follow-up instructions were discussed.  ____________________________________________  FINAL CLINICAL IMPRESSION(S) / ED DIAGNOSES  Final diagnoses:  Chest pain, unspecified type  Palpitations         NEW MEDICATIONS STARTED DURING THIS VISIT:  New Prescriptions   No medications on file      Rockne Menghini, MD 11/25/16 1253

## 2016-11-25 NOTE — Discharge Instructions (Signed)
Please take Motrin or Tylenol for your chest pain. Follow up with the cardiologist for an outpatient stress test.  Return to the emergency department if you develop severe pain, shortness of breath, palpitations, lightheadedness or fainting, or any other symptoms concerning to you.

## 2016-11-25 NOTE — ED Notes (Signed)
Patient transported to X-ray 

## 2016-11-26 ENCOUNTER — Telehealth: Payer: Self-pay

## 2016-11-26 NOTE — Telephone Encounter (Signed)
Pt was seen in ED on 11/25/16 Needs a new pt fu here in office  She was at another doctor's office  She would call back to schedule

## 2016-12-02 NOTE — Telephone Encounter (Signed)
Pt is coming in on 12/15/16 to see Dr End Nothing further needed

## 2016-12-15 ENCOUNTER — Ambulatory Visit: Payer: Medicare HMO | Admitting: Internal Medicine

## 2016-12-15 ENCOUNTER — Encounter: Payer: Self-pay | Admitting: Internal Medicine

## 2016-12-23 ENCOUNTER — Ambulatory Visit (INDEPENDENT_AMBULATORY_CARE_PROVIDER_SITE_OTHER): Payer: Medicare HMO | Admitting: Cardiovascular Disease

## 2016-12-23 ENCOUNTER — Encounter: Payer: Self-pay | Admitting: Cardiovascular Disease

## 2016-12-23 VITALS — BP 158/82 | HR 82 | Ht 62.0 in | Wt 211.8 lb

## 2016-12-23 DIAGNOSIS — R079 Chest pain, unspecified: Secondary | ICD-10-CM

## 2016-12-23 DIAGNOSIS — R011 Cardiac murmur, unspecified: Secondary | ICD-10-CM

## 2016-12-23 NOTE — Patient Instructions (Addendum)
Testing/Procedures: Your physician has requested that you have an echocardiogram. Echocardiography is a painless test that uses sound waves to create images of your heart. It provides your doctor with information about the size and shape of your heart and how well your heart's chambers and valves are working. This procedure takes approximately one hour. There are no restrictions for this procedure.  [facility] MYOVIEW  Your caregiver has ordered a Stress Test with nuclear imaging. The purpose of this test is to evaluate the blood supply to your heart muscle. This procedure is referred to as a "Non-Invasive Stress Test." This is because other than having an IV started in your vein, nothing is inserted or "invades" your body. Cardiac stress tests are done to find areas of poor blood flow to the heart by determining the extent of coronary artery disease (CAD). Some patients exercise on a treadmill, which naturally increases the blood flow to your heart, while others who are  unable to walk on a treadmill due to physical limitations have a pharmacologic/chemical stress agent called Lexiscan . This medicine will mimic walking on a treadmill by temporarily increasing your coronary blood flow.   Please note: these test may take anywhere between 2-4 hours to complete  PLEASE REPORT TO [facility] MEDICAL MALL ENTRANCE  THE VOLUNTEERS AT THE FIRST DESK WILL DIRECT YOU WHERE TO GO  Date of Procedure:_Tuesday June 5, 2018__  Arrival Time for Procedure:_Arrive at 09:15AM___  Instructions regarding medication:   __X__:  Hold Atenolol the night before procedure and morning of procedure    PLEASE NOTIFY THE OFFICE AT LEAST 24 HOURS IN ADVANCE IF YOU ARE UNABLE TO KEEP YOUR APPOINTMENT.  [phone] AND  PLEASE NOTIFY NUCLEAR MEDICINE AT [facility] AT LEAST 24 HOURS IN ADVANCE IF YOU ARE UNABLE TO KEEP YOUR APPOINTMENT. [phone]  How to prepare for your Myoview test:  1. Do not eat or drink after midnight 2. No  caffeine for 24 hours prior to test 3. No smoking 24 hours prior to test. 4. Your medication may be taken with water.  If your doctor stopped a medication because of this test, do not take that medication. 5. Ladies, please do not wear dresses.  Skirts or pants are appropriate. Please wear a short sleeve shirt. 6. No perfume, cologne or lotion. 7. Wear comfortable walking shoes. No heels!   Follow-Up: Your physician recommends that you schedule a follow-up appointment as needed. We will call you with results and if needed schedule follow up at that time.   It was a pleasure seeing you today here in the office. Please do not hesitate to give [place] a call back if you have any further questions. [phone]  [place] RN, BSN     Echocardiogram An echocardiogram, or echocardiography, uses sound waves (ultrasound) to produce an image of your heart. The echocardiogram is simple, painless, obtained within a short period of time, and offers valuable information to your health care provider. The images from an echocardiogram can provide information such as:  Evidence of coronary artery disease (CAD).  Heart size.  Heart muscle function.  Heart valve function.  Aneurysm detection.  Evidence of a past heart attack.  Fluid buildup around the heart.  Heart muscle thickening.  Assess heart valve function.  Tell a health care provider about:  Any allergies you have.  All medicines you are taking, including vitamins, herbs, eye drops, creams, and over-the-counter medicines.  Any problems you or family members have had with anesthetic medicines.  Any  blood disorders you have.  Any surgeries you have had.  Any medical conditions you have.  Whether you are pregnant or may be pregnant. What happens before the procedure? No special preparation is needed. Eat and drink normally. What happens during the procedure?  In order to produce an image of your heart, gel will be applied to  your chest and a wand-like tool (transducer) will be moved over your chest. The gel will help transmit the sound waves from the transducer. The sound waves will harmlessly bounce off your heart to allow the heart images to be captured in real-time motion. These images will then be recorded.  You may need an IV to receive a medicine that improves the quality of the pictures. What happens after the procedure? You may return to your normal schedule including diet, activities, and medicines, unless your health care provider tells you otherwise. This information is not intended to replace advice given to you by your health care provider. Make sure you discuss any questions you have with your health care provider. Document Released: 07/09/2000 Document Revised: 02/28/2016 Document Reviewed: 03/19/2013 Elsevier Interactive Patient Education  2017 Elsevier Inc.    Cardiac Nuclear Scan A cardiac nuclear scan is a test that measures blood flow to the heart when a person is resting and when he or she is exercising. The test looks for problems such as:  Not enough blood reaching a portion of the heart.  The heart muscle not working normally.  You may need this test if:  You have heart disease.  You have had abnormal lab results.  You have had heart surgery or angioplasty.  You have chest pain.  You have shortness of breath.  In this test, a radioactive dye (tracer) is injected into your bloodstream. After the tracer has traveled to your heart, an imaging device is used to measure how much of the tracer is absorbed by or distributed to various areas of your heart. This procedure is usually done at a hospital and takes 2-4 hours. Tell a health care provider about:  Any allergies you have.  All medicines you are taking, including vitamins, herbs, eye drops, creams, and over-the-counter medicines.  Any problems you or family members have had with the use of anesthetic medicines.  Any blood  disorders you have.  Any surgeries you have had.  Any medical conditions you have.  Whether you are pregnant or may be pregnant. What are the risks? Generally, this is a safe procedure. However, problems may occur, including:  Serious chest pain and heart attack. This is only a risk if the stress portion of the test is done.  Rapid heartbeat.  Sensation of warmth in your chest. This usually passes quickly.  What happens before the procedure?  Ask your health care provider about changing or stopping your regular medicines. This is especially important if you are taking diabetes medicines or blood thinners.  Remove your jewelry on the day of the procedure. What happens during the procedure?  An IV tube will be inserted into one of your veins.  Your health care provider will inject a small amount of radioactive tracer through the tube.  You will wait for 20-40 minutes while the tracer travels through your bloodstream.  Your heart activity will be monitored with an electrocardiogram (ECG).  You will lie down on an exam table.  Images of your heart will be taken for about 15-20 minutes.  You may be asked to exercise on a treadmill or stationary  bike. While you exercise, your heart's activity will be monitored with an ECG, and your blood pressure will be checked. If you are unable to exercise, you may be given a medicine to increase blood flow to parts of your heart.  When blood flow to your heart has peaked, a tracer will again be injected through the IV tube.  After 20-40 minutes, you will get back on the exam table and have more images taken of your heart.  When the procedure is over, your IV tube will be removed. The procedure may vary among health care providers and hospitals. Depending on the type of tracer used, scans may need to be repeated 3-4 hours later. What happens after the procedure?  Unless your health care provider tells you otherwise, you may return to your  normal schedule, including diet, activities, and medicines.  Unless your health care provider tells you otherwise, you may increase your fluid intake. This will help flush the contrast dye from your body. Drink enough fluid to keep your urine clear or pale yellow.  It is up to you to get your test results. Ask your health care provider, or the department that is doing the test, when your results will be ready. Summary  A cardiac nuclear scan measures the blood flow to the heart when a person is resting and when he or she is exercising.  You may need this test if you are at risk for heart disease.  Tell your health care provider if you are pregnant.  Unless your health care provider tells you otherwise, increase your fluid intake. This will help flush the contrast dye from your body. Drink enough fluid to keep your urine clear or pale yellow. This information is not intended to replace advice given to you by your health care provider. Make sure you discuss any questions you have with your health care provider. Document Released: 08/06/2004 Document Revised: 07/14/2016 Document Reviewed: 06/20/2013 Elsevier Interactive Patient Education  2017 [employer]

## 2016-12-23 NOTE — Progress Notes (Signed)
Cardiology Office Note   Date:  12/23/2016   ID:  Elizabeth [name], DOB 1961-06-19, MRN [id number]  PCP:  Martie RoundSpencer, Nicole, NP  Cardiologist:   Lorine BearsMuhammad Takai Chiaramonte, MD   Chief Complaint  Patient presents with  . other    hospital follow up. Patient c/o fast heart rate and chest pain when moving a certain way. Meds reviewed verbally with patient.       History of Present Illness: Elizabeth [name] is a 56 y.o. female who [facility] for evaluation of chest pain. She has no previous cardiac history and has known history of hypertension. She underwent colonoscopy on May 1 and later that day she had episodes of sharp chest pain under her left breast radiating to the back and left upper extremities that was positional. She had no other associated symptoms. She reported episodes of palpitations as well. Basic workup in the ED was unremarkable with negative troponin. She was noted to have suppressed TSH. Chest x-ray showed no acute abnormalities. She reports overall improvement in symptoms although she continues to complain of exertional dyspnea and fatigue. She had cardiac workup in 2007 that included a stress test according to her. These records are not available. She is not a smoker. Family history is remarkable for hypertension. Her mother had myocardial infarction later in life and had a pacemaker done.    Past Medical History:  Diagnosis Date  . Hypertension     Past Surgical History:  Procedure Laterality Date  . BACK SURGERY    . CHOLECYSTECTOMY    . NECK SURGERY       Current Outpatient Prescriptions  Medication Sig Dispense Refill  . albuterol (VENTOLIN HFA) 108 (90 Base) MCG/ACT inhaler Inhale 1-2 puffs into the lungs every 6 (six) hours as needed.    Marland Kitchen. aspirin 81 MG chewable tablet Chew 81 mg by mouth daily.    Marland Kitchen. atenolol (TENORMIN) 25 MG tablet Take 1 tablet by mouth daily.    . cetirizine (ZYRTEC) 5 MG tablet Take 5 mg by mouth daily.    . Cholecalciferol (D  2000) 2000 units TABS Take 1 tablet by mouth daily.    . fluticasone (FLONASE) 50 MCG/ACT nasal spray Place 2 sprays into both nostrils as needed.    . hydrochlorothiazide (HYDRODIURIL) 25 MG tablet Take 1 tablet by mouth daily.    Marland Kitchen. losartan (COZAAR) 50 MG tablet Take 50 mg by mouth daily.    . vitamin B-12 (CYANOCOBALAMIN) 1000 MCG tablet Take 1,000 mcg by mouth daily.     No current facility-administered medications for this visit.     Allergies:   Lisinopril    Social History:  The patient  reports that she has never smoked. She has never used smokeless tobacco. She reports that she does not drink alcohol or use drugs.   Family History:  The patient's family history includes COPD in her father; Congestive Heart Failure in her mother; Diabetes in her mother; Emphysema in her father; Heart attack in her mother; Hypertension in her father and mother; Stroke in her mother.    ROS:  Please see the history of present illness.   Otherwise, review of systems are positive for none.   All other systems are reviewed and negative.    PHYSICAL EXAM: VS:  BP (!) 158/82 (BP Location: Right Arm, Patient Position: Sitting, Cuff Size: Normal)   Pulse 82   Ht 5\' 2"  (1.575 m)   Wt 211 lb 12 oz (  96 kg)   BMI 38.73 kg/m  , BMI Body mass index is 38.73 kg/m. GEN: Well nourished, well developed, in no acute distress  HEENT: normal  Neck: no JVD, carotid bruits, or masses Cardiac: RRR; no  rubs, or gallops,no edema . One out of 6 systolic ejection murmur at the base of the heart. Respiratory:  clear to auscultation bilaterally, normal work of breathing GI: soft, nontender, nondistended, + BS MS: no deformity or atrophy  Skin: warm and dry, no rash Neuro:  Strength and sensation are intact Psych: euthymic mood, full affect   EKG:  EKG is ordered today. The ekg ordered today demonstrates normal sinus rhythm with no significant ST or T wave changes.   Recent Labs: 11/25/2016: BUN 10; Creatinine,  Ser 0.56; Hemoglobin 11.8; Platelets 189; Potassium 3.5; Sodium 138; TSH <0.010    Lipid Panel No results found for: CHOL, TRIG, HDL, CHOLHDL, VLDL, LDLCALC, LDLDIRECT    Wt Readings from Last 3 Encounters:  12/23/16 211 lb 12 oz (96 kg)  11/25/16 215 lb (97.5 kg)  07/13/16 217 lb (98.4 kg)       PAD Screen 12/23/2016  Previous PAD dx? No  Previous surgical procedure? No  Pain with walking? Yes  Subsides with rest? Yes  Feet/toe relief with dangling? Yes  Painful, non-healing ulcers? No  Extremities discolored? No      ASSESSMENT AND PLAN:  1.  Atypical chest pain: The chest pain seems to be musculoskeletal in etiology based on her description. However, she reports worsening exertional dyspnea and she has risk factors for coronary artery disease. Thus, I recommend evaluation with a pharmacologic nuclear stress test. She reports inability to exercise on a treadmill due to left leg pain.  2. Cardiac murmur: The murmur seems to be benign overall but given her shortness of breath, I requested an echocardiogram.  3. Abnormal TSH: TSH was suppressed and given her complaints of palpitations, I recommend further evaluation of this to ensure no hyperthyroidism. I advised her to follow-up with her primary care physician regarding this.    Disposition:   FU with me as needed.   Signed,  Lorine Bears, MD  12/23/2016 11:28 AM    [facility]

## 2016-12-27 ENCOUNTER — Telehealth: Payer: Self-pay | Admitting: Cardiovascular Disease

## 2016-12-27 ENCOUNTER — Other Ambulatory Visit: Payer: Self-pay | Admitting: Nurse Practitioner

## 2016-12-27 DIAGNOSIS — Z1239 Encounter for other screening for malignant neoplasm of breast: Secondary | ICD-10-CM

## 2016-12-27 NOTE — Telephone Encounter (Signed)
S/w pt who understands tomorrow's Celine Ahrmyoview has been cancelled as it is awaiting insurance approval. She is agreeable w/plan. Will call back once insurance process completed.

## 2016-12-27 NOTE — Telephone Encounter (Signed)
[facility] Sheralyn Boatmanoni calling stating patient is scheduled for Myoview in the morning  She is calling for they need to reschedule this due to insurance still saying it is "pending"  Please advise.

## 2016-12-27 NOTE — Telephone Encounter (Signed)
Sheralyn Boatmanoni, [facility], states pt's insurance is still pending. Need to cancel myoview until after insurance approval. Called pt's number. No answer, no VM. S/w pt's daughter, Sunny SchleinFelicia, ([place] who will notify pt

## 2016-12-28 ENCOUNTER — Telehealth: Payer: Self-pay | Admitting: Cardiovascular Disease

## 2016-12-28 NOTE — Telephone Encounter (Signed)
Pt agreeable to reschedule lexi myoview Monday, June 11, arrival 7:15am. Reviewed instructions w/pt who verbalized understanding.

## 2016-12-28 NOTE — Telephone Encounter (Signed)
Per Delrae AlfredAshley Wertz, lexi Celine Ahrmyoview has been approved, Berkley Harveyauth #[id number]. Attempted to call pt back to reschedule. No answer, no VM.

## 2017-01-03 ENCOUNTER — Ambulatory Visit
Admission: RE | Admit: 2017-01-03 | Discharge: 2017-01-03 | Disposition: A | Discharge status: Home / Self Care | Payer: Medicare HMO | Source: Ambulatory Visit | Attending: Cardiovascular Disease | Admitting: Cardiovascular Disease

## 2017-01-03 DIAGNOSIS — R079 Chest pain, unspecified: Secondary | ICD-10-CM | POA: Diagnosis not present

## 2017-01-03 LAB — NM MYOCAR MULTI W/SPECT W/WALL MOTION / EF
CHL CUP NUCLEAR SDS: 0
CHL CUP NUCLEAR SRS: 1
CHL CUP NUCLEAR SSS: 1
CHL CUP RESTING HR STRESS: 88 {beats}/min
CSEPEW: 1 METS
CSEPPHR: 127 {beats}/min
Exercise duration (min): 0 min
Exercise duration (sec): 0 s
LV dias vol: 68 mL (ref 46–106)
LVSYSVOL: 21 mL
MPHR: 167 {beats}/min
NUC STRESS TID: 0.83
Percent HR: 76 %

## 2017-01-03 MED ORDER — TECHNETIUM TC 99M TETROFOSMIN IV KIT
13.0000 | PACK | Freq: Once | INTRAVENOUS | Status: AC | PRN
  Administered 2017-01-03: 12.04 via INTRAVENOUS

## 2017-01-03 MED ORDER — TECHNETIUM TC 99M TETROFOSMIN IV KIT
30.0000 | PACK | Freq: Once | INTRAVENOUS | Status: AC | PRN
  Administered 2017-01-03: 29.32 via INTRAVENOUS

## 2017-01-03 MED ORDER — REGADENOSON 0.4 MG/5ML IV SOLN
0.4000 mg | Freq: Once | INTRAVENOUS | Status: AC
  Administered 2017-01-03: 0.4 mg via INTRAVENOUS
  Filled 2017-01-03: qty 5

## 2017-01-12 ENCOUNTER — Other Ambulatory Visit: Payer: Medicare HMO

## 2017-01-14 ENCOUNTER — Other Ambulatory Visit: Payer: Self-pay

## 2017-01-14 ENCOUNTER — Ambulatory Visit (INDEPENDENT_AMBULATORY_CARE_PROVIDER_SITE_OTHER): Payer: Medicare HMO

## 2017-01-14 DIAGNOSIS — R011 Cardiac murmur, unspecified: Secondary | ICD-10-CM | POA: Diagnosis not present

## 2017-02-20 ENCOUNTER — Emergency Department
Admission: EM | Admit: 2017-02-20 | Discharge: 2017-02-20 | Disposition: A | Discharge status: Home / Self Care | Payer: Medicare HMO | Attending: Emergency Medicine | Admitting: Emergency Medicine

## 2017-02-20 ENCOUNTER — Encounter: Payer: Self-pay | Admitting: Emergency Medicine

## 2017-02-20 DIAGNOSIS — S46912A Strain of unspecified muscle, fascia and tendon at shoulder and upper arm level, left arm, initial encounter: Secondary | ICD-10-CM | POA: Insufficient documentation

## 2017-02-20 DIAGNOSIS — Y999 Unspecified external cause status: Secondary | ICD-10-CM | POA: Diagnosis not present

## 2017-02-20 DIAGNOSIS — E039 Hypothyroidism, unspecified: Secondary | ICD-10-CM | POA: Insufficient documentation

## 2017-02-20 DIAGNOSIS — I10 Essential (primary) hypertension: Secondary | ICD-10-CM | POA: Diagnosis not present

## 2017-02-20 DIAGNOSIS — Y929 Unspecified place or not applicable: Secondary | ICD-10-CM | POA: Diagnosis not present

## 2017-02-20 DIAGNOSIS — Y939 Activity, unspecified: Secondary | ICD-10-CM | POA: Diagnosis not present

## 2017-02-20 DIAGNOSIS — S4992XA Unspecified injury of left shoulder and upper arm, initial encounter: Secondary | ICD-10-CM | POA: Diagnosis present

## 2017-02-20 DIAGNOSIS — X58XXXA Exposure to other specified factors, initial encounter: Secondary | ICD-10-CM | POA: Diagnosis not present

## 2017-02-20 DIAGNOSIS — Z79899 Other long term (current) drug therapy: Secondary | ICD-10-CM | POA: Diagnosis not present

## 2017-02-20 HISTORY — DX: Disorder of thyroid, unspecified: E07.9

## 2017-02-20 MED ORDER — BACLOFEN 5 MG PO TABS: 5.0000 mg | ORAL_TABLET | Freq: Three times a day (TID) | ORAL | 0 refills | Status: AC

## 2017-02-20 MED ORDER — IBUPROFEN 600 MG PO TABS: 600.0000 mg | ORAL_TABLET | Freq: Three times a day (TID) | ORAL | 0 refills | Status: DC | PRN

## 2017-02-20 NOTE — ED Provider Notes (Signed)
[facility] Emergency Department Provider Note   ____________________________________________   First MD Initiated Contact with Patient 02/20/17 2225     (approximate)  I have reviewed the triage vital signs and the nursing notes.   HISTORY  Chief Complaint Shoulder Pain    HPI Elizabeth [name] is a 56 y.o. female patient complaining of left posterior shoulder pain which started today. Patient denies provocative incident. Patient states she took tramadol for pain with no improvement. Patient rates pain as a 7/10. Patient described a pain as "achy and spasmatic. Patient stated pain increases with adduction and overhead reaching. Patient is right-hand dominant.   Past Medical History:  Diagnosis Date  . Hypertension   . Thyroid disease     Patient Active Problem List   Diagnosis Date Noted  . Low back pain 07/13/2016  . Essential hypertension, benign 07/13/2016  . Pain in limb 07/13/2016    Past Surgical History:  Procedure Laterality Date  . BACK SURGERY    . CHOLECYSTECTOMY    . NECK SURGERY      Prior to Admission medications   Medication Sig Start Date End Date Taking? Authorizing Provider  albuterol (VENTOLIN HFA) 108 (90 Base) MCG/ACT inhaler Inhale 1-2 puffs into the lungs every 6 (six) hours as needed. 07/09/13   [provider]  aspirin 81 MG chewable tablet Chew 81 mg by mouth daily.    [provider]  atenolol (TENORMIN) 25 MG tablet Take 1 tablet by mouth daily. 10/15/13   [provider]  Baclofen 5 MG TABS Take 5 mg by mouth 3 (three) times daily. 02/20/17   Joni ReiningSmith, Seferino Oscar K, PA-C  cetirizine (ZYRTEC) 5 MG tablet Take 5 mg by mouth daily.    [provider]  Cholecalciferol (D 2000) 2000 units TABS Take 1 tablet by mouth daily.    [provider]  fluticasone (FLONASE) 50 MCG/ACT nasal spray Place 2 sprays into both nostrils as needed. 04/19/11   [provider]    hydrochlorothiazide (HYDRODIURIL) 25 MG tablet Take 1 tablet by mouth daily. 07/28/13   [provider]  ibuprofen (ADVIL,MOTRIN) 600 MG tablet Take 1 tablet (600 mg total) by mouth every 8 (eight) hours as needed. 02/20/17   Joni ReiningSmith, Hamad Whyte K, PA-C  losartan (COZAAR) 50 MG tablet Take 50 mg by mouth daily.    [provider]  vitamin B-12 (CYANOCOBALAMIN) 1000 MCG tablet Take 1,000 mcg by mouth daily.    [provider]    Allergies Lisinopril  Family History  Problem Relation Age of Onset  . Congestive Heart Failure Mother   . Diabetes Mother   . Heart attack Mother   . Hypertension Mother   . Stroke Mother   . Emphysema Father   . COPD Father   . Hypertension Father     Social History Social History  Substance Use Topics  . Smoking status: Never Smoker  . Smokeless tobacco: Never Used  . Alcohol use No    Review of Systems  Constitutional: No fever/chills Eyes: No visual changes. ENT: No sore throat. Cardiovascular: Denies chest pain. Respiratory: Denies shortness of breath. Gastrointestinal: No abdominal pain.  No nausea, no vomiting.  No diarrhea.  No constipation. Genitourinary: Negative for dysuria. Musculoskeletal: Negative for back pain. Skin: Negative for rash. Neurological: Negative for headaches, focal weakness or numbness. Endocrine:Hypertension and hypothyroidism. Hematological/Lymphatic: Allergic/Immunilogical: Lisinopril ____________________________________________   PHYSICAL EXAM:  VITAL SIGNS: ED Triage Vitals [02/20/17 2111]  Enc Vitals Group  BP (!) 164/94     Pulse Rate 88     Resp 18     Temp 98.6 F (37 C)     Temp Source Oral     SpO2 97 %     Weight 207 lb (93.9 kg)     Height 5\' 2"  (1.575 m)     Head Circumference      Peak Flow      Pain Score 7     Pain Loc      Pain Edu?      Excl. in GC?     Constitutional: Alert and oriented. Well appearing and in no acute distress. Neck: No stridor.  No  cervical spine tenderness to palpation. Cardiovascular: Normal rate, regular rhythm. Grossly normal heart sounds.  Good peripheral circulation. Elevated blood pressure Respiratory: Normal respiratory effort.  No retractions. Lungs CTAB. Musculoskeletal: No lower extremity tenderness nor edema.  No joint effusions. Neurologic:  Normal speech and language. No gross focal neurologic deficits are appreciated. No gait instability. Skin:  Skin is warm, dry and intact. No rash noted. Psychiatric: Mood and affect are normal. Speech and behavior are normal.  ____________________________________________   LABS (all labs ordered are listed, but only abnormal results are displayed)  Labs Reviewed - No data to display ____________________________________________  EKG   ____________________________________________  RADIOLOGY  No results found.  ____________________________________________   PROCEDURES  Procedure(s) performed: None  Procedures  Critical Care performed: No  ____________________________________________   INITIAL IMPRESSION / ASSESSMENT AND PLAN / ED COURSE  Pertinent labs & imaging results that were available during my care of the patient were reviewed by me and considered in my medical decision making (see chart for details).  Left scapular muscle strain. Patient given discharge care instruction. Patient advised follow up PCP. Patient given a work note.      ____________________________________________   FINAL CLINICAL IMPRESSION(S) / ED DIAGNOSES  Final diagnoses:  Strain of left shoulder, initial encounter      NEW MEDICATIONS STARTED DURING THIS VISIT:  New Prescriptions   BACLOFEN 5 MG TABS    Take 5 mg by mouth 3 (three) times daily.   IBUPROFEN (ADVIL,MOTRIN) 600 MG TABLET    Take 1 tablet (600 mg total) by mouth every 8 (eight) hours as needed.     Note:  This document was prepared using Dragon voice recognition software and may include  unintentional dictation errors.    Joni ReiningSmith, Jennell Janosik K, PA-C 02/20/17 2245    Emily FilbertWilliams, Jonathan E, MD 02/20/17 2250

## 2017-02-20 NOTE — ED Triage Notes (Signed)
Patient with complaint of left shoulder pain that started today. Patient denies injury. Patient states that she took tramdol for the pain with no improvement. Patient has had similar pain in the past and was told that it was muscle spasms.

## 2017-02-21 ENCOUNTER — Ambulatory Visit
Admission: RE | Admit: 2017-02-21 | Discharge: 2017-02-21 | Disposition: A | Discharge status: Home / Self Care | Payer: Medicare HMO | Source: Ambulatory Visit | Attending: Nurse Practitioner | Admitting: Nurse Practitioner

## 2017-02-21 ENCOUNTER — Other Ambulatory Visit: Payer: Self-pay | Admitting: Nurse Practitioner

## 2017-02-21 DIAGNOSIS — R7611 Nonspecific reaction to tuberculin skin test without active tuberculosis: Secondary | ICD-10-CM | POA: Insufficient documentation

## 2017-02-21 DIAGNOSIS — Z227 Latent tuberculosis: Secondary | ICD-10-CM

## 2017-02-21 DIAGNOSIS — J45909 Unspecified asthma, uncomplicated: Secondary | ICD-10-CM | POA: Diagnosis not present

## 2017-05-17 ENCOUNTER — Ambulatory Visit
Admission: RE | Admit: 2017-05-17 | Discharge: 2017-05-17 | Disposition: A | Discharge status: Home / Self Care | Payer: Medicare HMO | Source: Ambulatory Visit | Attending: Nurse Practitioner | Admitting: Nurse Practitioner

## 2017-05-17 ENCOUNTER — Other Ambulatory Visit: Payer: Self-pay | Admitting: Nurse Practitioner

## 2017-05-17 DIAGNOSIS — M25561 Pain in right knee: Secondary | ICD-10-CM | POA: Diagnosis present

## 2017-05-17 DIAGNOSIS — M25569 Pain in unspecified knee: Secondary | ICD-10-CM

## 2017-11-18 ENCOUNTER — Inpatient Hospital Stay: Admission: RE | Admit: 2017-11-18 | Payer: Medicare HMO | Source: Ambulatory Visit

## 2018-03-01 ENCOUNTER — Encounter: Payer: Self-pay | Admitting: Emergency Medicine

## 2018-03-01 ENCOUNTER — Other Ambulatory Visit: Payer: Self-pay

## 2018-03-01 DIAGNOSIS — Z5321 Procedure and treatment not carried out due to patient leaving prior to being seen by health care provider: Secondary | ICD-10-CM | POA: Insufficient documentation

## 2018-03-01 DIAGNOSIS — M25512 Pain in left shoulder: Secondary | ICD-10-CM | POA: Diagnosis not present

## 2018-03-01 LAB — CBC
HEMATOCRIT: 38.9 % (ref 35.0–47.0)
HEMOGLOBIN: 13.1 g/dL (ref 12.0–16.0)
MCH: 30.3 pg (ref 26.0–34.0)
MCHC: 33.6 g/dL (ref 32.0–36.0)
MCV: 90.1 fL (ref 80.0–100.0)
Platelets: 186 10*3/uL (ref 150–440)
RBC: 4.32 MIL/uL (ref 3.80–5.20)
RDW: 14.2 % (ref 11.5–14.5)
WBC: 9.3 10*3/uL (ref 3.6–11.0)

## 2018-03-01 LAB — BASIC METABOLIC PANEL
ANION GAP: 9 (ref 5–15)
BUN: 16 mg/dL (ref 6–20)
CALCIUM: 9.3 mg/dL (ref 8.9–10.3)
CO2: 27 mmol/L (ref 22–32)
Chloride: 103 mmol/L (ref 98–111)
Creatinine, Ser: 0.86 mg/dL (ref 0.44–1.00)
Glucose, Bld: 103 mg/dL — ABNORMAL HIGH (ref 70–99)
POTASSIUM: 3.7 mmol/L (ref 3.5–5.1)
Sodium: 139 mmol/L (ref 135–145)

## 2018-03-01 LAB — TROPONIN I

## 2018-03-01 NOTE — ED Triage Notes (Addendum)
Patient to ER for c/o pain to left shoulder and left arm. Patient saw PCP last week for c/o same. States she received rx for Tramadol and steroid injection at that time. States she felt like it was getting better. States now pain has gotten worse again. Denies any known injury. Reports pain to left neck all the way down to left arm (to elbow). States pain was present when waking up one am. No neuro deficits present.

## 2018-03-02 ENCOUNTER — Emergency Department
Admission: EM | Admit: 2018-03-02 | Discharge: 2018-03-02 | Discharge status: Against Medical Advice | Payer: Medicare HMO | Attending: Emergency Medicine | Admitting: Emergency Medicine

## 2018-03-02 ENCOUNTER — Telehealth: Payer: Self-pay | Admitting: Emergency Medicine

## 2018-03-02 NOTE — Telephone Encounter (Signed)
Called patient due to lwot to inquire about condition and follow up plans. Left message.   

## 2018-03-02 NOTE — ED Notes (Signed)
No answer when called several times from lobby 

## 2018-03-07 ENCOUNTER — Other Ambulatory Visit: Payer: Self-pay | Admitting: Nurse Practitioner

## 2018-03-07 DIAGNOSIS — M779 Enthesopathy, unspecified: Secondary | ICD-10-CM

## 2018-07-24 ENCOUNTER — Other Ambulatory Visit: Payer: Self-pay | Admitting: Nurse Practitioner

## 2018-07-24 DIAGNOSIS — Z1231 Encounter for screening mammogram for malignant neoplasm of breast: Secondary | ICD-10-CM

## 2018-08-22 ENCOUNTER — Ambulatory Visit
Admission: RE | Admit: 2018-08-22 | Discharge: 2018-08-22 | Disposition: A | Discharge status: Home / Self Care | Payer: Medicare HMO | Source: Ambulatory Visit | Attending: Nurse Practitioner | Admitting: Nurse Practitioner

## 2018-08-22 DIAGNOSIS — Z1231 Encounter for screening mammogram for malignant neoplasm of breast: Secondary | ICD-10-CM | POA: Diagnosis not present

## 2019-04-18 ENCOUNTER — Emergency Department
Admission: EM | Admit: 2019-04-18 | Discharge: 2019-04-18 | Disposition: A | Discharge status: Home / Self Care | Payer: Medicare HMO | Attending: Emergency Medicine | Admitting: Emergency Medicine

## 2019-04-18 ENCOUNTER — Other Ambulatory Visit: Payer: Self-pay

## 2019-04-18 ENCOUNTER — Encounter: Payer: Self-pay | Admitting: Emergency Medicine

## 2019-04-18 DIAGNOSIS — I10 Essential (primary) hypertension: Secondary | ICD-10-CM | POA: Insufficient documentation

## 2019-04-18 DIAGNOSIS — Z7982 Long term (current) use of aspirin: Secondary | ICD-10-CM | POA: Insufficient documentation

## 2019-04-18 DIAGNOSIS — Z79899 Other long term (current) drug therapy: Secondary | ICD-10-CM | POA: Insufficient documentation

## 2019-04-18 DIAGNOSIS — R1011 Right upper quadrant pain: Secondary | ICD-10-CM | POA: Insufficient documentation

## 2019-04-18 LAB — COMPREHENSIVE METABOLIC PANEL
ALT: 30 U/L (ref 0–44)
AST: 23 U/L (ref 15–41)
Albumin: 4.8 g/dL (ref 3.5–5.0)
Alkaline Phosphatase: 87 U/L (ref 38–126)
Anion gap: 9 (ref 5–15)
BUN: 16 mg/dL (ref 6–20)
CO2: 32 mmol/L (ref 22–32)
Calcium: 10 mg/dL (ref 8.9–10.3)
Chloride: 100 mmol/L (ref 98–111)
Creatinine, Ser: 0.94 mg/dL (ref 0.44–1.00)
GFR calc Af Amer: >60 mL/min (ref >60)
GFR calc non Af Amer: >60 mL/min (ref >60)
Glucose, Bld: 101 mg/dL — ABNORMAL HIGH (ref 70–99)
Potassium: 4.3 mmol/L (ref 3.5–5.1)
Sodium: 141 mmol/L (ref 135–145)
Total Bilirubin: 0.7 mg/dL (ref 0.3–1.2)
Total Protein: 8.4 g/dL — ABNORMAL HIGH (ref 6.5–8.1)

## 2019-04-18 LAB — URINALYSIS, COMPLETE (UACMP) WITH MICROSCOPIC
Bilirubin Urine: NEGATIVE
Glucose, UA: NEGATIVE mg/dL
Ketones, ur: NEGATIVE mg/dL
Leukocytes,Ua: NEGATIVE
Nitrite: NEGATIVE
Protein, ur: NEGATIVE mg/dL
Specific Gravity, Urine: 1.004 — ABNORMAL LOW (ref 1.005–1.030)
pH: 8 (ref 5.0–8.0)

## 2019-04-18 LAB — CBC
HCT: 42.1 % (ref 36.0–46.0)
Hemoglobin: 13.7 g/dL (ref 12.0–15.0)
MCH: 29.2 pg (ref 26.0–34.0)
MCHC: 32.5 g/dL (ref 30.0–36.0)
MCV: 89.8 fL (ref 80.0–100.0)
Platelets: 214 10*3/uL (ref 150–400)
RBC: 4.69 MIL/uL (ref 3.87–5.11)
RDW: 13.3 % (ref 11.5–15.5)
WBC: 6.5 10*3/uL (ref 4.0–10.5)
nRBC: 0 % (ref 0.0–0.2)

## 2019-04-18 LAB — LIPASE, BLOOD: Lipase: 20 U/L (ref 11–51)

## 2019-04-18 MED ORDER — SUCRALFATE 1 G PO TABS: 1.0000 g | ORAL_TABLET | Freq: Four times a day (QID) | ORAL | 0 refills | Status: AC

## 2019-04-18 NOTE — ED Triage Notes (Signed)
Pt in via POV, reports right flank pain x approximately 1 month.  Denies any urinary symptoms.  NAD noted at this time.

## 2019-04-18 NOTE — ED Provider Notes (Signed)
[facility] Emergency Department Provider Note    ____________________________________________   I have reviewed the triage vital signs and the nursing notes.   HISTORY  Chief Complaint Right upper quadrant pain  History limited by: Not Limited   HPI Elizabeth [name] is a 58 y.o. female who presents to the emergency department today because of concerns for right upper quadrant abdominal pain.  Patient states the pain is been present for the past month.  It will radiate to her back.  It is worse after she eats fried foods.  She has been trying an antacid without any significant relief.  As it is some change and increase in loose stools.  Denies any bloody stool or black or tarry stool.  The patient had her gallbladder removed over a decade ago.  She denies any fevers.   Records reviewed. Per medical record review patient has a history of cholecystectomy.   Past Medical History:  Diagnosis Date  . Hypertension   . Thyroid disease     Patient Active Problem List   Diagnosis Date Noted  . Low back pain 07/13/2016  . Essential hypertension, benign 07/13/2016  . Pain in limb 07/13/2016    Past Surgical History:  Procedure Laterality Date  . BACK SURGERY    . CHOLECYSTECTOMY    . NECK SURGERY      Prior to Admission medications   Medication Sig Start Date End Date Taking? Authorizing Provider  albuterol (VENTOLIN HFA) 108 (90 Base) MCG/ACT inhaler Inhale 1-2 puffs into the lungs every 6 (six) hours as needed. 07/09/13   [provider]  aspirin 81 MG chewable tablet Chew 81 mg by mouth daily.    [provider]  atenolol (TENORMIN) 25 MG tablet Take 1 tablet by mouth daily. 10/15/13   [provider]  Baclofen 5 MG TABS Take 5 mg by mouth 3 (three) times daily. 02/20/17   Joni Reining, PA-C  cetirizine (ZYRTEC) 5 MG tablet Take 5 mg by mouth daily.    [provider]  Cholecalciferol (D 2000) 2000 units TABS Take 1  tablet by mouth daily.    [provider]  fluticasone (FLONASE) 50 MCG/ACT nasal spray Place 2 sprays into both nostrils as needed. 04/19/11   [provider]  hydrochlorothiazide (HYDRODIURIL) 25 MG tablet Take 1 tablet by mouth daily. 07/28/13   [provider]  ibuprofen (ADVIL,MOTRIN) 600 MG tablet Take 1 tablet (600 mg total) by mouth every 8 (eight) hours as needed. 02/20/17   Joni Reining, PA-C  losartan (COZAAR) 50 MG tablet Take 50 mg by mouth daily.    [provider]  vitamin B-12 (CYANOCOBALAMIN) 1000 MCG tablet Take 1,000 mcg by mouth daily.    [provider]    Allergies Lisinopril  Family History  Problem Relation Age of Onset  . Congestive Heart Failure Mother   . Diabetes Mother   . Heart attack Mother   . Hypertension Mother   . Stroke Mother   . Emphysema Father   . COPD Father   . Hypertension Father   . Breast cancer Maternal Aunt        >50  . Breast cancer Cousin        maternal 1st cousin    Social History Social History   Tobacco Use  . Smoking status: Never Smoker  . Smokeless tobacco: Never Used  Substance Use Topics  . Alcohol use: No  . Drug use: No  Review of Systems Constitutional: No fever/chills Eyes: No visual changes. ENT: No sore throat. Cardiovascular: Denies chest pain. Respiratory: Denies shortness of breath. Gastrointestinal: Positive for right upper quadrant pain.  Genitourinary: Negative for dysuria. Musculoskeletal: Positive for back pain. Skin: Negative for rash. Neurological: Negative for headaches, focal weakness or numbness.  ____________________________________________   PHYSICAL EXAM:  VITAL SIGNS: ED Triage Vitals [04/18/19 1047]  Enc Vitals Group     BP (!) 182/101     Pulse Rate 74     Resp 16     Temp 98.2 F (36.8 C)     Temp Source Oral     SpO2 99 %     Weight 215 lb (97.5 kg)     Height 5\' 2"  (1.575 m)     Head Circumference      Peak Flow       Pain Score 7   Constitutional: Alert and oriented.  Eyes: Conjunctivae are normal.  ENT      Head: Normocephalic and atraumatic.      Nose: No congestion/rhinnorhea.      Mouth/Throat: Mucous membranes are moist.      Neck: No stridor. Hematological/Lymphatic/Immunilogical: No cervical lymphadenopathy. Cardiovascular: Normal rate, regular rhythm.  No murmurs, rubs, or gallops.  Respiratory: Normal respiratory effort without tachypnea nor retractions. Breath sounds are clear and equal bilaterally. No wheezes/rales/rhonchi. Gastrointestinal: Soft and minimally tender in the right upper quadrant and epigastric region. Genitourinary: Deferred Musculoskeletal: Normal range of motion in all extremities. No lower extremity edema. Neurologic:  Normal speech and language. No gross focal neurologic deficits are appreciated.  Skin:  Skin is warm, dry and intact. No rash noted. Psychiatric: Mood and affect are normal. Speech and behavior are normal. Patient exhibits appropriate insight and judgment.  ____________________________________________    LABS (pertinent positives/negatives)  Lipase 20 CBC wbc 6.5, hgb 13.7, plt 214 CMP wnl except glu 101, t pro 8.4  ____________________________________________   EKG  None  ____________________________________________    RADIOLOGY  None  ____________________________________________   PROCEDURES  Procedures  ____________________________________________   INITIAL IMPRESSION / ASSESSMENT AND PLAN / ED COURSE  Pertinent labs & imaging results that were available during my care of the patient were reviewed by me and considered in my medical decision making (see chart for details).   Patient presented to the emergency department today because of concerns for right upper quadrant pain that is been present for the past month.  It is worse when she eats fried foods.  She is status post cholecystectomy.  Blood work does not show any  elevation of LFTs or lipase.  At this point I do wonder if patient suffering from gastritis/duodenitis/ulcer.  Discussed this with the patient.  Discussed importance of taking an acid daily.  Will add on sucralfate.  Will give patient dietary change information.  Will give patient GI follow-up.  Discussed return precautions. ____________________________________________   FINAL CLINICAL IMPRESSION(S) / ED DIAGNOSES  Final diagnoses:  Right upper quadrant pain     Note: This dictation was prepared with Dragon dictation. Any transcriptional errors that result from this process are unintentional     Nance Pear, MD 04/18/19 1340

## 2019-04-18 NOTE — Discharge Instructions (Addendum)
Please seek medical attention for any high fevers, chest pain, shortness of breath, change in behavior, persistent vomiting, bloody stool or any other new or concerning symptoms.  

## 2019-04-18 NOTE — ED Notes (Signed)
Pt given water to attempt to get some urine.

## 2019-07-10 ENCOUNTER — Emergency Department
Admission: EM | Admit: 2019-07-10 | Discharge: 2019-07-10 | Disposition: A | Discharge status: Home / Self Care | Payer: Medicare HMO | Attending: Emergency Medicine | Admitting: Emergency Medicine

## 2019-07-10 ENCOUNTER — Emergency Department: Payer: Medicare HMO

## 2019-07-10 ENCOUNTER — Other Ambulatory Visit: Payer: Self-pay

## 2019-07-10 DIAGNOSIS — W010XXA Fall on same level from slipping, tripping and stumbling without subsequent striking against object, initial encounter: Secondary | ICD-10-CM | POA: Diagnosis not present

## 2019-07-10 DIAGNOSIS — Y999 Unspecified external cause status: Secondary | ICD-10-CM | POA: Insufficient documentation

## 2019-07-10 DIAGNOSIS — I10 Essential (primary) hypertension: Secondary | ICD-10-CM | POA: Insufficient documentation

## 2019-07-10 DIAGNOSIS — S638X1A Sprain of other part of right wrist and hand, initial encounter: Secondary | ICD-10-CM | POA: Diagnosis not present

## 2019-07-10 DIAGNOSIS — Z7982 Long term (current) use of aspirin: Secondary | ICD-10-CM | POA: Diagnosis not present

## 2019-07-10 DIAGNOSIS — S6991XA Unspecified injury of right wrist, hand and finger(s), initial encounter: Secondary | ICD-10-CM | POA: Diagnosis present

## 2019-07-10 DIAGNOSIS — S63501A Unspecified sprain of right wrist, initial encounter: Secondary | ICD-10-CM

## 2019-07-10 DIAGNOSIS — Z79899 Other long term (current) drug therapy: Secondary | ICD-10-CM | POA: Diagnosis not present

## 2019-07-10 DIAGNOSIS — Y92039 Unspecified place in apartment as the place of occurrence of the external cause: Secondary | ICD-10-CM | POA: Insufficient documentation

## 2019-07-10 DIAGNOSIS — Y9301 Activity, walking, marching and hiking: Secondary | ICD-10-CM | POA: Diagnosis not present

## 2019-07-10 NOTE — ED Triage Notes (Signed)
Pt states she slipped and fell on some steps this morning and is c/o right wrist pain. Denies any other injures at this time

## 2019-07-10 NOTE — ED Provider Notes (Signed)
[facility] Emergency Department Provider Note  ____________________________________________   First MD Initiated Contact with Patient 07/10/19 1514     (approximate)  I have reviewed the triage vital signs and the nursing notes.   HISTORY  Chief Complaint Wrist Injury    HPI Elizabeth [name] is a 58 y.o. female presents emergency department complaining of a right wrist injury after falling at her apartment.  She states her ankle gave way and she fell forward protecting her head with her right hand and wrist.  States now she keeps having pain in the right wrist.  No other injuries.  No loss of consciousness.  No numbness or tingling at this time.  Patient has ulcers and cannot take NSAIDs.    Past Medical History:  Diagnosis Date  . Hypertension   . Thyroid disease     Patient Active Problem List   Diagnosis Date Noted  . Low back pain 07/13/2016  . Essential hypertension, benign 07/13/2016  . Pain in limb 07/13/2016    Past Surgical History:  Procedure Laterality Date  . BACK SURGERY    . CHOLECYSTECTOMY    . NECK SURGERY      Prior to Admission medications   Medication Sig Start Date End Date Taking? Authorizing Provider  albuterol (VENTOLIN HFA) 108 (90 Base) MCG/ACT inhaler Inhale 1-2 puffs into the lungs every 6 (six) hours as needed. 07/09/13   [provider]  aspirin 81 MG chewable tablet Chew 81 mg by mouth daily.    [provider]  atenolol (TENORMIN) 25 MG tablet Take 1 tablet by mouth daily. 10/15/13   [provider]  Baclofen 5 MG TABS Take 5 mg by mouth 3 (three) times daily. 02/20/17   Joni ReiningSmith, Ronald K, PA-C  cetirizine (ZYRTEC) 5 MG tablet Take 5 mg by mouth daily.    [provider]  Cholecalciferol (D 2000) 2000 units TABS Take 1 tablet by mouth daily.    [provider]  fluticasone (FLONASE) 50 MCG/ACT nasal spray Place 2 sprays into both nostrils as needed. 04/19/11   [provider]  hydrochlorothiazide (HYDRODIURIL) 25 MG tablet Take 1 tablet by mouth daily. 07/28/13   [provider]  ibuprofen (ADVIL,MOTRIN) 600 MG tablet Take 1 tablet (600 mg total) by mouth every 8 (eight) hours as needed. 02/20/17   Joni ReiningSmith, Ronald K, PA-C  losartan (COZAAR) 50 MG tablet Take 50 mg by mouth daily.    [provider]  sucralfate (CARAFATE) 1 g tablet Take 1 tablet (1 g total) by mouth 4 (four) times daily. 04/18/19   Phineas SemenGoodman, Graydon, MD  vitamin B-12 (CYANOCOBALAMIN) 1000 MCG tablet Take 1,000 mcg by mouth daily.    [provider]    Allergies Lisinopril  Family History  Problem Relation Age of Onset  . Congestive Heart Failure Mother   . Diabetes Mother   . Heart attack Mother   . Hypertension Mother   . Stroke Mother   . Emphysema Father   . COPD Father   . Hypertension Father   . Breast cancer Maternal Aunt        >50  . Breast cancer Cousin        maternal 1st cousin    Social History Social History   Tobacco Use  . Smoking status: Never Smoker  . Smokeless tobacco: Never Used  Substance Use Topics  . Alcohol use: No  . Drug use: No    Review of Systems .  Constitutional: No fever/chills Eyes: No visual changes. ENT: No sore throat. Respiratory: Denies cough Genitourinary: Negative for dysuria. Musculoskeletal: Negative for back pain.  Positive for right wrist pain Skin: Negative for rash.    ____________________________________________   PHYSICAL EXAM:  VITAL SIGNS: ED Triage Vitals  Enc Vitals Group     BP 07/10/19 1421 (!) 161/94     Pulse Rate 07/10/19 1421 87     Resp 07/10/19 1421 18     Temp 07/10/19 1421 98.2 F (36.8 C)     Temp Source 07/10/19 1421 Oral     SpO2 07/10/19 1421 95 %     Weight 07/10/19 1419 215 lb (97.5 kg)     Height 07/10/19 1419 5\' 2"  (1.575 m)     Head Circumference --      Peak Flow --      Pain Score 07/10/19 1419 6     Pain Loc --      Pain Edu? --      Excl. in  GC? --     Constitutional: Alert and oriented. Well appearing and in no acute distress. Eyes: Conjunctivae are normal.  Head: Atraumatic. Nose: No congestion/rhinnorhea. Mouth/Throat: Mucous membranes are moist.   Neck:  supple no lymphadenopathy noted Cardiovascular: Normal rate, regular rhythm.  Respiratory: Normal respiratory effort.  No retractions, GU: deferred Musculoskeletal: FROM all extremities, warm and well perfused, right wrist is tender to palpation, tender at the ulna more so than the radial, minimal swelling if any noted, neurovascular is intact Neurologic:  Normal speech and language.  Skin:  Skin is warm, dry and intact. No rash noted. Psychiatric: Mood and affect are normal. Speech and behavior are normal.  ____________________________________________   LABS (all labs ordered are listed, but only abnormal results are displayed)  Labs Reviewed - No data to display ____________________________________________   ____________________________________________  RADIOLOGY  X-ray of the right wrist is negative  ____________________________________________   PROCEDURES  Procedure(s) performed: Velcro cock-up splint applied by the tech   Procedures    ____________________________________________   INITIAL IMPRESSION / ASSESSMENT AND PLAN / ED COURSE  Pertinent labs & imaging results that were available during my care of the patient were reviewed by me and considered in my medical decision making (see chart for details).   Patient is 58 year old female presents emergency department after fall this morning.  She is complaining of right wrist pain.  See HPI  Physical exam shows patient to appear well.  Right wrist is tender.  Neurovascular is intact.  X-ray of the right wrist is negative for fracture  Explained the findings to the patient.  She was placed in a Velcro cock-up splint.  She is to follow-up with orthopedics if not better in 1 week.  Elevate and  ice.  Take Tylenol for pain as needed.  Return if worsening.  States she understands and was discharged in stable condition.    Elizabeth [name] was evaluated in Emergency Department on 07/10/2019 for the symptoms described in the history of present illness. She was evaluated in the context of the global COVID-19 pandemic, which necessitated consideration that the patient might be at risk for infection with the SARS-CoV-2 virus that causes COVID-19. Institutional protocols and algorithms that pertain to the evaluation of patients at risk for COVID-19 are in a state of rapid change based on information released by regulatory bodies including the CDC and federal and state organizations. These policies and algorithms were followed during the patient's care in the  ED.   As part of my medical decision making, I reviewed the following data within the [place] notes reviewed and incorporated, Old chart reviewed, Radiograph reviewed , Notes from prior ED visits and [place] Controlled Substance Database  ____________________________________________   FINAL CLINICAL IMPRESSION(S) / ED DIAGNOSES  Final diagnoses:  Wrist sprain, right, initial encounter      NEW MEDICATIONS STARTED DURING THIS VISIT:  New Prescriptions   No medications on file     Note:  This document was prepared using Dragon voice recognition software and may include unintentional dictation errors.    Versie Starks, PA-C 07/10/19 1530    Carrie Mew, MD 07/10/19 [phone]

## 2019-07-10 NOTE — ED Notes (Signed)
See triage note  Presents s/p fall   Having pain to right wrist  Good pulses

## 2019-07-10 NOTE — Discharge Instructions (Signed)
Follow up with your regular doctor as needed.  Follow up with dr Roland Rack if not better in 1 week.  Elevate and ice the wrist.  Tylenol for pain as needed

## 2019-07-24 ENCOUNTER — Other Ambulatory Visit: Payer: Self-pay | Admitting: Family Medicine

## 2019-07-24 DIAGNOSIS — Z1231 Encounter for screening mammogram for malignant neoplasm of breast: Secondary | ICD-10-CM

## 2019-07-26 ENCOUNTER — Other Ambulatory Visit: Payer: Self-pay

## 2019-07-26 DIAGNOSIS — Z5321 Procedure and treatment not carried out due to patient leaving prior to being seen by health care provider: Secondary | ICD-10-CM | POA: Diagnosis not present

## 2019-07-26 DIAGNOSIS — R05 Cough: Secondary | ICD-10-CM | POA: Insufficient documentation

## 2019-07-26 DIAGNOSIS — R0789 Other chest pain: Secondary | ICD-10-CM | POA: Diagnosis not present

## 2019-07-26 LAB — TROPONIN I (HIGH SENSITIVITY): Troponin I (High Sensitivity): 4 ng/L (ref <18)

## 2019-07-26 LAB — BASIC METABOLIC PANEL
Anion gap: 11 (ref 5–15)
BUN: 12 mg/dL (ref 6–20)
CO2: 29 mmol/L (ref 22–32)
Calcium: 9.9 mg/dL (ref 8.9–10.3)
Chloride: 98 mmol/L (ref 98–111)
Creatinine, Ser: 0.75 mg/dL (ref 0.44–1.00)
GFR calc Af Amer: >60 mL/min (ref >60)
GFR calc non Af Amer: >60 mL/min (ref >60)
Glucose, Bld: 123 mg/dL — ABNORMAL HIGH (ref 70–99)
Potassium: 3.9 mmol/L (ref 3.5–5.1)
Sodium: 138 mmol/L (ref 135–145)

## 2019-07-26 LAB — CBC
HCT: 37.5 % (ref 36.0–46.0)
Hemoglobin: 12.7 g/dL (ref 12.0–15.0)
MCH: 29.5 pg (ref 26.0–34.0)
MCHC: 33.9 g/dL (ref 30.0–36.0)
MCV: 87 fL (ref 80.0–100.0)
Platelets: 198 10*3/uL (ref 150–400)
RBC: 4.31 MIL/uL (ref 3.87–5.11)
RDW: 13.5 % (ref 11.5–15.5)
WBC: 7.1 10*3/uL (ref 4.0–10.5)
nRBC: 0 % (ref 0.0–0.2)

## 2019-07-26 MED ORDER — SODIUM CHLORIDE 0.9% FLUSH: 3.0000 mL | Freq: Once | INTRAVENOUS | Status: DC

## 2019-07-26 NOTE — ED Triage Notes (Addendum)
Pt to the er for chest pain and left arm pain since 0500. Pt reports pain is intermittent and is pressure over the left upper chest. Pt reports a continued cough from  covid.  Was in quarantine for 2 weeks ending 2 weeks ago. Pt has a hx of PNE.

## 2019-07-27 ENCOUNTER — Emergency Department
Admission: EM | Admit: 2019-07-27 | Discharge: 2019-07-27 | Disposition: A | Discharge status: Home / Self Care | Payer: Medicare HMO | Attending: Emergency Medicine | Admitting: Emergency Medicine

## 2019-07-27 HISTORY — DX: Unspecified asthma, uncomplicated: J45.909

## 2019-07-27 NOTE — ED Notes (Signed)
No answer when called several times from lobby; called pt on phone & st that she had already left

## 2019-08-24 ENCOUNTER — Ambulatory Visit
Admission: RE | Admit: 2019-08-24 | Discharge: 2019-08-24 | Disposition: A | Discharge status: Home / Self Care | Payer: Medicare HMO | Source: Ambulatory Visit | Attending: Family Medicine | Admitting: Family Medicine

## 2019-08-24 DIAGNOSIS — Z1231 Encounter for screening mammogram for malignant neoplasm of breast: Secondary | ICD-10-CM | POA: Diagnosis not present

## 2019-09-04 ENCOUNTER — Other Ambulatory Visit: Payer: Self-pay

## 2019-09-04 ENCOUNTER — Encounter: Payer: Self-pay | Admitting: Emergency Medicine

## 2019-09-04 ENCOUNTER — Emergency Department
Admission: EM | Admit: 2019-09-04 | Discharge: 2019-09-04 | Disposition: A | Discharge status: Home / Self Care | Payer: Medicare HMO | Attending: Emergency Medicine | Admitting: Emergency Medicine

## 2019-09-04 DIAGNOSIS — Z79899 Other long term (current) drug therapy: Secondary | ICD-10-CM | POA: Insufficient documentation

## 2019-09-04 DIAGNOSIS — I10 Essential (primary) hypertension: Secondary | ICD-10-CM | POA: Diagnosis not present

## 2019-09-04 DIAGNOSIS — Z043 Encounter for examination and observation following other accident: Secondary | ICD-10-CM | POA: Insufficient documentation

## 2019-09-04 DIAGNOSIS — W19XXXA Unspecified fall, initial encounter: Secondary | ICD-10-CM

## 2019-09-04 DIAGNOSIS — Z7982 Long term (current) use of aspirin: Secondary | ICD-10-CM | POA: Insufficient documentation

## 2019-09-04 DIAGNOSIS — J45909 Unspecified asthma, uncomplicated: Secondary | ICD-10-CM | POA: Diagnosis not present

## 2019-09-04 NOTE — ED Provider Notes (Signed)
[facility] Emergency Department Provider Note  ____________________________________________  Time seen: Approximately 3:45 PM  I have reviewed the triage vital signs and the nursing notes.   HISTORY  Chief Complaint Fall    HPI Elizabeth [name] is a 59 y.o. female that presents to the emergency department for evaluation after fall today.  Patient states that she slipped on water at her house and landed on her right side.  She landed on her right shoulder. She feels stiff to her right arm. She did not land on her back or her hip.   No hip pain.  No back pain.  She does not feel that anything is broken.  No shortness of breath, chest pain, abdominal pain.   Past Medical History:  Diagnosis Date  . Asthma   . Hypertension   . Thyroid disease     Patient Active Problem List   Diagnosis Date Noted  . Low back pain 07/13/2016  . Essential hypertension, benign 07/13/2016  . Pain in limb 07/13/2016    Past Surgical History:  Procedure Laterality Date  . BACK SURGERY    . CHOLECYSTECTOMY    . NECK SURGERY      Prior to Admission medications   Medication Sig Start Date End Date Taking? Authorizing Provider  albuterol (VENTOLIN HFA) 108 (90 Base) MCG/ACT inhaler Inhale 1-2 puffs into the lungs every 6 (six) hours as needed. 07/09/13   [provider]  aspirin 81 MG chewable tablet Chew 81 mg by mouth daily.    [provider]  atenolol (TENORMIN) 25 MG tablet Take 1 tablet by mouth daily. 10/15/13   [provider]  Baclofen 5 MG TABS Take 5 mg by mouth 3 (three) times daily. 02/20/17   Joni Reining, PA-C  cetirizine (ZYRTEC) 5 MG tablet Take 5 mg by mouth daily.    [provider]  Cholecalciferol (D 2000) 2000 units TABS Take 1 tablet by mouth daily.    [provider]  fluticasone (FLONASE) 50 MCG/ACT nasal spray Place 2 sprays into both nostrils as needed. 04/19/11   [provider]   hydrochlorothiazide (HYDRODIURIL) 25 MG tablet Take 1 tablet by mouth daily. 07/28/13   [provider]  ibuprofen (ADVIL,MOTRIN) 600 MG tablet Take 1 tablet (600 mg total) by mouth every 8 (eight) hours as needed. 02/20/17   Joni Reining, PA-C  losartan (COZAAR) 50 MG tablet Take 50 mg by mouth daily.    [provider]  sucralfate (CARAFATE) 1 g tablet Take 1 tablet (1 g total) by mouth 4 (four) times daily. 04/18/19   Phineas Semen, MD  vitamin B-12 (CYANOCOBALAMIN) 1000 MCG tablet Take 1,000 mcg by mouth daily.    [provider]    Allergies Lisinopril  Family History  Problem Relation Age of Onset  . Congestive Heart Failure Mother   . Diabetes Mother   . Heart attack Mother   . Hypertension Mother   . Stroke Mother   . Emphysema Father   . COPD Father   . Hypertension Father   . Breast cancer Maternal Aunt        >50  . Breast cancer Cousin        maternal 1st cousin    Social History Social History   Tobacco Use  . Smoking status: Never Smoker  . Smokeless tobacco: Never Used  Substance Use Topics  . Alcohol use: No  . Drug use: No     Review of  Systems  Cardiovascular: No chest pain. Respiratory: No SOB. Gastrointestinal: No abdominal pain.  No nausea, no vomiting.  Musculoskeletal: Positive for shoulder and arm stiffness.  Skin: Negative for rash, abrasions, lacerations, ecchymosis. Neurological: Negative for headaches   ____________________________________________   PHYSICAL EXAM:  VITAL SIGNS: ED Triage Vitals  Enc Vitals Group     BP 09/04/19 1508 136/78     Pulse Rate 09/04/19 1508 70     Resp 09/04/19 1508 16     Temp 09/04/19 1508 98.5 F (36.9 C)     Temp Source 09/04/19 1508 Oral     SpO2 09/04/19 1508 95 %     Weight 09/04/19 1505 216 lb (98 kg)     Height 09/04/19 1505 5\' 2"  (1.575 m)     Head Circumference --      Peak Flow --      Pain Score 09/04/19 1505 6     Pain Loc --      Pain Edu? --       Excl. in [place]? --      Constitutional: Alert and oriented. Well appearing and in no acute distress. Eyes: Conjunctivae are normal. PERRL. EOMI. Head: Atraumatic. ENT:      Ears:      Nose: No congestion/rhinnorhea.      Mouth/Throat: Mucous membranes are moist.  Neck: No stridor.  No cervical spine tenderness to palpation. Cardiovascular: Normal rate, regular rhythm.  Good peripheral circulation. Respiratory: Normal respiratory effort without tachypnea or retractions. Lungs CTAB. Good air entry to the bases with no decreased or absent breath sounds. Gastrointestinal: Soft and nontender to palpation. No guarding or rigidity. No palpable masses. No distention. Musculoskeletal: Full range of motion to all extremities. No gross deformities appreciated.  Mild tenderness palpation to anterior right shoulder.  Full range of motion of bilateral shoulders and upper extremities.  Strength equal in upper extremities.  Normal gait. Neurologic:  Normal speech and language. No gross focal neurologic deficits are appreciated.  Skin:  Skin is warm, dry and intact. No rash noted. Psychiatric: Mood and affect are normal. Speech and behavior are normal. Patient exhibits appropriate insight and judgement.   ____________________________________________   LABS (all labs ordered are listed, but only abnormal results are displayed)  Labs Reviewed - No data to display ____________________________________________  EKG   ____________________________________________  RADIOLOGY  No results found.  ____________________________________________    PROCEDURES  Procedure(s) performed:    Procedures    Medications - No data to display   ____________________________________________   INITIAL IMPRESSION / ASSESSMENT AND PLAN / ED COURSE  Pertinent labs & imaging results that were available during my care of the patient were reviewed by me and considered in my medical decision making (see chart  for details).  Review of the [place] CSRS was performed in accordance of the [place] prior to dispensing any controlled drugs.    Patient presented to emergency department for evaluation after fall today.  Vital signs and exam are reassuring.  Exam is overall unremarkable.   Patient is to follow up with primary care as directed. Patient is given ED precautions to return to the ED for any worsening or new symptoms.  Elizabeth [name] was evaluated in Emergency Department on 09/04/2019 for the symptoms described in the history of present illness. She was evaluated in the context of the global COVID-19 pandemic, which necessitated consideration that the patient might be at risk for infection with the SARS-CoV-2 virus that causes COVID-19. Institutional protocols and  algorithms that pertain to the evaluation of patients at risk for COVID-19 are in a state of rapid change based on information released by regulatory bodies including the CDC and federal and state organizations. These policies and algorithms were followed during the patient's care in the ED.   ____________________________________________  FINAL CLINICAL IMPRESSION(S) / ED DIAGNOSES  Final diagnoses:  Fall, initial encounter      NEW MEDICATIONS STARTED DURING THIS VISIT:  ED Discharge Orders    None          This chart was dictated using voice recognition software/Dragon. Despite best efforts to proofread, errors can occur which can change the meaning. Any change was purely unintentional.    Enid Derry, PA-C 09/04/19 1909    Emily Filbert, MD 09/11/19 1309

## 2019-09-04 NOTE — ED Triage Notes (Signed)
Pt slipped and fell in water today at house.  C/o pain to right hip/leg and right arm.  Ambulatory to triage without difficulty. NAD. VSS. Did not hit head.

## 2019-09-04 NOTE — Discharge Instructions (Signed)
You can ice shoulder tonight.  Take Tylenol for pain.  You can take your baclofen to help relax your muscles.  Use your Lidoderm patches.  Please call primary care tomorrow morning for a follow-up appointment this week.

## 2020-10-01 ENCOUNTER — Other Ambulatory Visit: Payer: Self-pay | Admitting: Family Medicine

## 2020-10-01 DIAGNOSIS — Z1231 Encounter for screening mammogram for malignant neoplasm of breast: Secondary | ICD-10-CM

## 2020-10-02 ENCOUNTER — Ambulatory Visit (INDEPENDENT_AMBULATORY_CARE_PROVIDER_SITE_OTHER): Payer: Medicare HMO | Admitting: Obstetrics

## 2020-10-02 ENCOUNTER — Other Ambulatory Visit (HOSPITAL_COMMUNITY)
Admission: RE | Admit: 2020-10-02 | Discharge: 2020-10-02 | Disposition: A | Discharge status: Home / Self Care | Payer: Medicare HMO | Source: Ambulatory Visit | Attending: Obstetrics | Admitting: Obstetrics

## 2020-10-02 ENCOUNTER — Encounter: Payer: Self-pay | Admitting: Obstetrics

## 2020-10-02 ENCOUNTER — Other Ambulatory Visit: Payer: Self-pay

## 2020-10-02 VITALS — BP 148/90 | Ht 62.0 in | Wt 208.0 lb

## 2020-10-02 DIAGNOSIS — Z124 Encounter for screening for malignant neoplasm of cervix: Secondary | ICD-10-CM

## 2020-10-02 DIAGNOSIS — Z01419 Encounter for gynecological examination (general) (routine) without abnormal findings: Secondary | ICD-10-CM

## 2020-10-02 DIAGNOSIS — Z1231 Encounter for screening mammogram for malignant neoplasm of breast: Secondary | ICD-10-CM | POA: Diagnosis not present

## 2020-10-02 NOTE — Progress Notes (Signed)
Gynecology Annual Exam  PCP: Leanna Sato, MD  Chief Complaint:  Chief Complaint  Patient presents with  . Annual Exam  . Menopause  . Hot Flashes  . vaginal dryness    History of Present Illness:Patient is a 60 y.o. [id number] presents for annual exam. The patient has complaints today related to menopause. She reports hot flashes and dypareunia. She has not had a GYN physical in many years. She cannot remember when she last had a pap smear. Elizabeth [name] is partnered longterm with the father of her 8 children, but they do not live together.  LMP: Patient's last menstrual period was 12/30/2017 (approximate). Menarche:not applicable  She is postmenpausal   The patient is occasionally sexually active. She admits to dyspareunia.  The patient does perform self breast exams.  There is notable family history of breast or ovarian cancer in her family. An aunt had breast Cancer in her late 92s, early 79s. The patient wears seatbelts: yes.   The patient has regular exercise: no.    The patient denies current symptoms of depression.     Review of Systems: Review of Systems  Constitutional: Negative.   HENT: Negative.   Eyes: Negative.   Respiratory:       Seasonal allergies  Cardiovascular: Negative.   Gastrointestinal: Positive for constipation.  Genitourinary: Positive for urgency.  Musculoskeletal: Positive for back pain and neck pain.       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   Neurological: Negative.   Endo/Heme/Allergies: Negative.   Psychiatric/Behavioral: Negative.     Past Medical History:  Patient Active Problem List   Diagnosis Date Noted  . Low back pain 07/13/2016  . Essential hypertension, benign 07/13/2016  . Pain in limb 07/13/2016    Past Surgical History:  Past Surgical History:  Procedure Laterality Date  . BACK SURGERY    . CHOLECYSTECTOMY    . NECK SURGERY      Gynecologic History:  Patient's last  menstrual period was 12/30/2017 (approximate). Last Pap: Results were: unknown (she can't remember- believes that she has had one abnormal pap, and the rest wer NILM) unknown  Last mammogram: 2021 Results were: BI-RAD I  Obstetric History: [id number]  Family History:  Family History  Problem Relation Age of Onset  . Congestive Heart Failure Mother   . Diabetes Mother   . Heart attack Mother   . Hypertension Mother   . Stroke Mother   . Emphysema Father   . COPD Father   . Hypertension Father   . Breast cancer Maternal Aunt        >50  . Breast cancer Cousin        maternal 1st cousin    Social History:  Social History   Socioeconomic History  . Marital status: Single    Spouse name: Not on file  . Number of children: Not on file  . Years of education: Not on file  . Highest education level: Not on file  Occupational History  . Not on file  Tobacco Use  . Smoking status: Never Smoker  . Smokeless tobacco: Never Used  Vaping Use  . Vaping Use: Never used  Substance and Sexual Activity  . Alcohol use: No  . Drug use: No  . Sexual activity: Yes  Other Topics Concern  . Not on file  Social History Narrative  . Not on file   Social Determinants of Health   Financial Resource  Strain: Not on file  Food Insecurity: Not on file  Transportation Needs: Not on file  Physical Activity: Not on file  Stress: Not on file  Social Connections: Not on file  Intimate Partner Violence: Not on file    Allergies:  Allergies  Allergen Reactions  . Lisinopril Swelling    Medications: Prior to Admission medications   Medication Sig Start Date End Date Taking? Authorizing Provider  albuterol (VENTOLIN HFA) 108 (90 Base) MCG/ACT inhaler Inhale 1-2 puffs into the lungs every 6 (six) hours as needed. 07/09/13  Yes [provider]  aspirin 81 MG chewable tablet Chew 81 mg by mouth daily.   Yes [provider]  atenolol (TENORMIN) 25 MG tablet Take 1 tablet by  mouth daily. 10/15/13  Yes [provider]  Baclofen 5 MG TABS Take 5 mg by mouth 3 (three) times daily. 02/20/17  Yes Joni Reining, PA-C  cetirizine (ZYRTEC) 5 MG tablet Take 5 mg by mouth daily.   Yes [provider]  Cholecalciferol 50 MCG (2000 UT) TABS Take 1 tablet by mouth daily.   Yes [provider]  fluticasone (FLONASE) 50 MCG/ACT nasal spray Place 2 sprays into both nostrils as needed. 04/19/11  Yes [provider]  furosemide (LASIX) 20 MG tablet Take by mouth. 11/22/19  Yes [provider]  ibuprofen (ADVIL,MOTRIN) 600 MG tablet Take 1 tablet (600 mg total) by mouth every 8 (eight) hours as needed. 02/20/17  Yes Joni Reining, PA-C  losartan (COZAAR) 50 MG tablet Take 50 mg by mouth daily.   Yes [provider]  sucralfate (CARAFATE) 1 g tablet Take 1 tablet (1 g total) by mouth 4 (four) times daily. 04/18/19  Yes Phineas Semen, MD  vitamin B-12 (CYANOCOBALAMIN) 1000 MCG tablet Take 1,000 mcg by mouth daily.   Yes [provider]  hydrochlorothiazide (HYDRODIURIL) 25 MG tablet Take 1 tablet by mouth daily. Patient not taking: Reported on 10/02/2020 07/28/13   [provider]    Physical Exam Vitals: Blood pressure (!) 148/90, height 5\' 2"  (1.575 m), weight 208 lb (94.3 kg), last menstrual period 12/30/2017.  General: NAD HEENT: normocephalic, anicteric Thyroid: no enlargement, no palpable nodules Pulmonary: No increased work of breathing, CTAB Cardiovascular: RRR, distal pulses 2+ Breast: Breast symmetrical, no tenderness, no palpable nodules or masses, no skin or nipple retraction present, no nipple discharge.  No axillary or supraclavicular lymphadenopathy. Abdomen: NABS, soft, non-tender, non-distended.  Umbilicus without lesions.  No hepatomegaly, splenomegaly or masses palpable. No evidence of hernia  Genitourinary:  External: Normal external female genitalia.  Normal urethral meatus, normal Bartholin's  and Skene's glands.    Vagina: smooth vaginal mucosa, no evidence of prolapse. Mild cystocele (hx of 8 SVDs)   Cervix: Grossly normal in appearance, no bleeding  Uterus: Non-enlarged, mobile, normal contour.  No CMT  Adnexa: ovaries non-enlarged, no adnexal masses  Rectal: deferred  Lymphatic: no evidence of inguinal lymphadenopathy Extremities: no edema, erythema, or tenderness Neurologic: Grossly intact Psychiatric: mood appropriate, affect full  Female chaperone present for pelvic and breast  portions of the physical exam     Assessment: 60 y.o. 60 routine annual exam  Plan: Problem List Items Addressed This Visit   None   Visit Diagnoses    Women's annual routine gynecological examination    -  Primary   Cervical cancer screening          1) Mammogram - recommend yearly screening mammogram.  Mammogram Was ordered today  2) STI screening  wasoffered and declined  3) ASCCP guidelines and rational discussed.  Patient opts for yearly screening interval  4) Osteoporosis  - per USPTF routine screening DEXA at age 77 -she is encouraged to have a DEXA starting at age 71   5) Routine healthcare maintenance including cholesterol, diabetes screening discussed managed by PCP  6) Colonoscopy up to date.  Screening recommended starting at age 49 for average risk individuals, age 25 for individuals deemed at increased risk (including African Americans) and recommended to continue until age 62.  For patient age 58-85 individualized approach is recommended.  Gold standard screening is via colonoscopy, Cologuard screening is an acceptable alternative for patient unwilling or unable to undergo colonoscopy.  "Colorectal cancer screening for average?risk adults: 2018 guideline update from the American Cancer Society"CA: A Cancer Journal for Clinicians: Dec 22, 2016   7) She  Plans on making an appointment with one of the MD providers to address her menopausal symptoms and discuss  options for treatment.  RTC in one year for her next [facility] exam.    Mirna Mires, CNM  10/02/2020 10:09 AM   Domingo Pulse, [facility] 10/02/2020, 9:44 AM

## 2020-10-07 LAB — CYTOLOGY - PAP
Comment: NEGATIVE
High risk HPV: NEGATIVE

## 2020-10-09 ENCOUNTER — Encounter: Payer: Self-pay | Admitting: Obstetrics

## 2020-10-09 DIAGNOSIS — R87619 Unspecified abnormal cytological findings in specimens from cervix uteri: Secondary | ICD-10-CM | POA: Insufficient documentation

## 2020-10-09 NOTE — Progress Notes (Signed)
LGSIL pap smear result. Note sent to the patient with counsel to have pap repeated in one year (negative for High Risk HPV). She has an appointment with Dr. Bjorn Pippin this week and is encouraged to bring her questions to that appointment.  Mirna Mires, CNM  10/09/2020 12:35 PM

## 2020-10-10 ENCOUNTER — Other Ambulatory Visit: Payer: Self-pay

## 2020-10-10 ENCOUNTER — Encounter: Payer: Self-pay | Admitting: Obstetrics and Gynecology

## 2020-10-10 ENCOUNTER — Ambulatory Visit (INDEPENDENT_AMBULATORY_CARE_PROVIDER_SITE_OTHER): Payer: Medicare HMO | Admitting: Obstetrics and Gynecology

## 2020-10-10 VITALS — BP 130/90 | Ht 62.0 in | Wt 206.0 lb

## 2020-10-10 DIAGNOSIS — R232 Flushing: Secondary | ICD-10-CM

## 2020-10-10 DIAGNOSIS — N898 Other specified noninflammatory disorders of vagina: Secondary | ICD-10-CM | POA: Diagnosis not present

## 2020-10-10 NOTE — Patient Instructions (Addendum)
Vaginal/ Vulvar Moisturizer Use 3-5 times a week at bedtime  Hyalo Gyn Revaree Replens Carlson Key-E suppositories Vitamin E oil, olive oil, coconut oil   Water-based Lubricants  Astroglide KY Jelly  Luvena Aquagel  Silicone- based Lubricants Pjur PINK Astroglide silicone Uberlube    

## 2020-10-10 NOTE — Progress Notes (Signed)
Patient ID: Elizabeth [name], female   DOB: 02-26-1961, 60 y.o.   MRN: [id number]  Reason for Consult: Menopause (Hot flashes, insomnia x 1 yr)   Referred by Leanna Sato, MD  Subjective:     HPI:  Elizabeth [name] is a 60 y.o. female. She is here today to discussed therapy options for her hot flashes and vaginal dryness/ pain with intercourse.   She reports she has been having hot flashes for 1-2 years. The hot flashes occur 3-4 times a day.   She has noticed sleep disturbances, irritability, and vaginal dryness. She is having discomfort with intercourse.   Gynecological History  Patient's last menstrual period was 12/30/2017 (approximate). Menarche: 13 or 14 Menopause: 47  History of fibroids, polyps, or ovarian cysts? : yes  History of PCOS? no Hstory of Endometriosis? no History of abnormal pap smears? no Have you had any sexually transmitted infections in the past? no  Last Pap: Results were: 2022 LSIL HPV negative    She identifies as a female. She is sexually active with men.   She has dyspareunia. She denies postcoital bleeding.   Obstetrical History 08/05/2006 vaginal delivery 37 weeks, female, preeclampsia  Past Medical History:  Diagnosis Date  . Asthma   . Hypertension   . Thyroid disease    Family History  Problem Relation Age of Onset  . Congestive Heart Failure Mother   . Diabetes Mother   . Heart attack Mother   . Hypertension Mother   . Stroke Mother   . Emphysema Father   . COPD Father   . Hypertension Father   . Breast cancer Maternal Aunt        >50  . Breast cancer Cousin        maternal 1st cousin   Past Surgical History:  Procedure Laterality Date  . BACK SURGERY    . CHOLECYSTECTOMY    . NECK SURGERY      Short Social History:  Social History   Tobacco Use  . Smoking status: Never Smoker  . Smokeless tobacco: Never Used  Substance Use Topics  . Alcohol use: No    Allergies  Allergen Reactions  . Lisinopril Swelling   . Other Rash    Current Outpatient Medications  Medication Sig Dispense Refill  . albuterol (VENTOLIN HFA) 108 (90 Base) MCG/ACT inhaler Inhale 1-2 puffs into the lungs every 6 (six) hours as needed.    Marland Kitchen aspirin 81 MG chewable tablet Chew 81 mg by mouth daily.    Marland Kitchen atenolol (TENORMIN) 25 MG tablet Take 1 tablet by mouth daily.    . Baclofen 5 MG TABS Take 5 mg by mouth 3 (three) times daily. 15 tablet 0  . cetirizine (ZYRTEC) 5 MG tablet Take 5 mg by mouth daily.    . Cholecalciferol 50 MCG (2000 UT) TABS Take 1 tablet by mouth daily.    . fluticasone (FLONASE) 50 MCG/ACT nasal spray Place 2 sprays into both nostrils as needed.    . furosemide (LASIX) 20 MG tablet Take by mouth.    Marland Kitchen ibuprofen (ADVIL,MOTRIN) 600 MG tablet Take 1 tablet (600 mg total) by mouth every 8 (eight) hours as needed. 15 tablet 0  . losartan (COZAAR) 50 MG tablet Take 50 mg by mouth daily.    . sucralfate (CARAFATE) 1 g tablet Take 1 tablet (1 g total) by mouth 4 (four) times daily. 60 tablet 0  . vitamin B-12 (CYANOCOBALAMIN) 1000 MCG tablet Take 1,000 mcg by  mouth daily.    . hydrochlorothiazide (HYDRODIURIL) 25 MG tablet Take 1 tablet by mouth daily. (Patient not taking: No sig reported)     No current facility-administered medications for this visit.    Review of Systems  Constitutional: Negative for chills, fatigue, fever and unexpected weight change.  HENT: Negative for trouble swallowing.  Eyes: Negative for loss of vision.  Respiratory: Negative for cough, shortness of breath and wheezing.  Cardiovascular: Negative for chest pain, leg swelling, palpitations and syncope.  GI: Negative for abdominal pain, blood in stool, diarrhea, nausea and vomiting.  GU: Negative for difficulty urinating, dysuria, frequency and hematuria.  Musculoskeletal: Negative for back pain, leg pain and joint pain.  Skin: Negative for rash.  Neurological: Negative for dizziness, headaches, light-headedness, numbness and  seizures.  Psychiatric: Negative for behavioral problem, confusion, depressed mood and sleep disturbance.        Objective:  Objective   Vitals:   10/10/20 1411  BP: 130/90  Weight: 206 lb (93.4 kg)  Height: 5\' 2"  (1.575 m)   Body mass index is 37.68 kg/m.  Physical Exam Vitals and nursing note reviewed. Exam conducted with a chaperone present.  Constitutional:      Appearance: Normal appearance.  HENT:     Head: Normocephalic and atraumatic.  Eyes:     Extraocular Movements: Extraocular movements intact.     Pupils: Pupils are equal, round, and reactive to light.  Cardiovascular:     Rate and Rhythm: Normal rate and regular rhythm.  Pulmonary:     Effort: Pulmonary effort is normal.     Breath sounds: Normal breath sounds.  Abdominal:     General: Abdomen is flat.     Palpations: Abdomen is soft.  Musculoskeletal:     Cervical back: Normal range of motion.  Skin:    General: Skin is warm and dry.  Neurological:     General: No focal deficit present.     Mental Status: She is alert and oriented to person, place, and time.  Psychiatric:        Behavior: Behavior normal.        Thought Content: Thought content normal.        Judgment: Judgment normal.     Assessment/Plan:     60 yo with vasomotor symptoms of menopause and vaginal dryness.  1. Patient is more than 5 years from initiation of menopause and for this reason would not be a good candidate for initiation of hormone replacement therapy. Discussed other options such as brisdelle, klonopin, or gabapentin. Patient will consider.    2. Vaginal dryness- discussed OTC therapies and provided her with a list of vaginal lubricants and moisturizers. Discussed other options such as intrarosa, osphena, and topical estrogen such as premarin and imvexxy. Provided her with information on these products.   3. Given general information regarding menopause.   4. Repeat pap smear in 1 year advised.   More than 30  minutes were spent face to face with the patient in the room, reviewing the medical record, labs and images, and coordinating care for the patient. The plan of management was discussed in detail and counseling was provided.     60 MD [facility] 10/10/2020 4:40 PM

## 2020-11-04 ENCOUNTER — Ambulatory Visit
Admission: RE | Admit: 2020-11-04 | Discharge: 2020-11-04 | Disposition: A | Discharge status: Home / Self Care | Payer: Medicare HMO | Source: Ambulatory Visit | Attending: Family Medicine | Admitting: Family Medicine

## 2020-11-04 ENCOUNTER — Other Ambulatory Visit: Payer: Self-pay

## 2020-11-04 DIAGNOSIS — Z1231 Encounter for screening mammogram for malignant neoplasm of breast: Secondary | ICD-10-CM | POA: Insufficient documentation

## 2021-03-14 IMAGING — MG DIGITAL SCREENING BILAT W/ TOMO W/ CAD
6 of 10 series · 6 of 30 positions shown · non-contrast
Comparison: Previous exam(s).

CLINICAL DATA: Screening.

EXAM:
DIGITAL SCREENING BILATERAL MAMMOGRAM WITH TOMO AND CAD

[L MLO synth-2D]
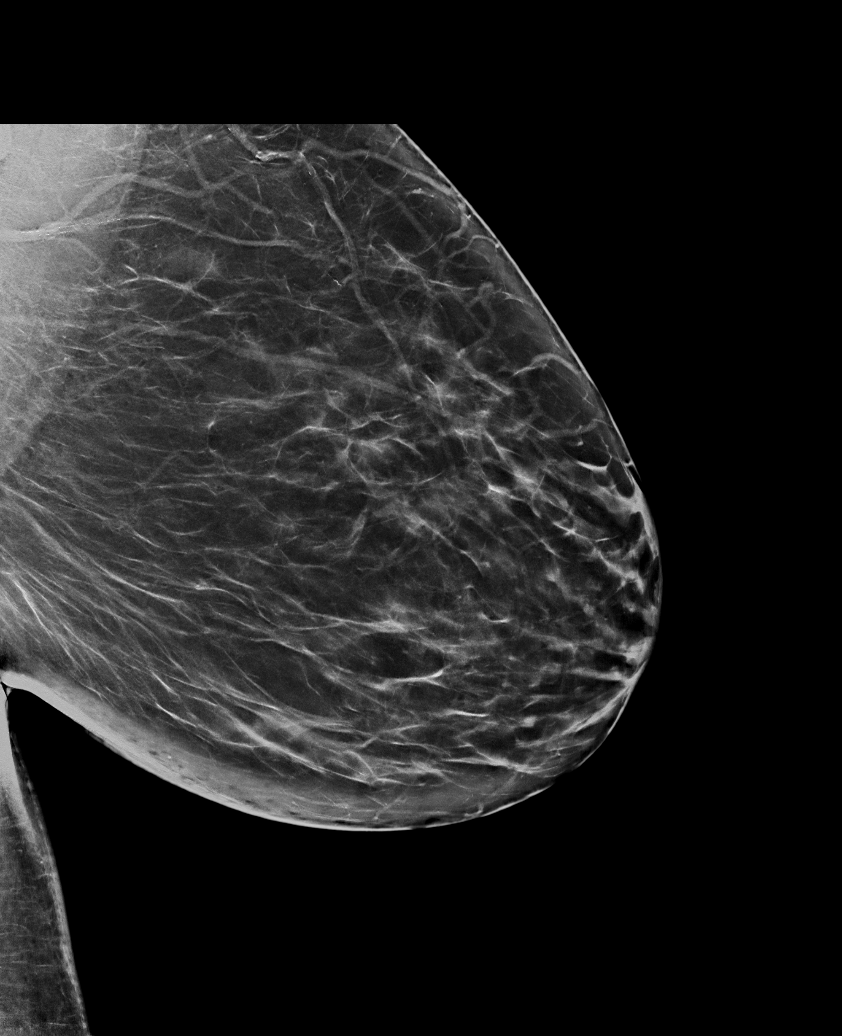

[R CC synth-2D]
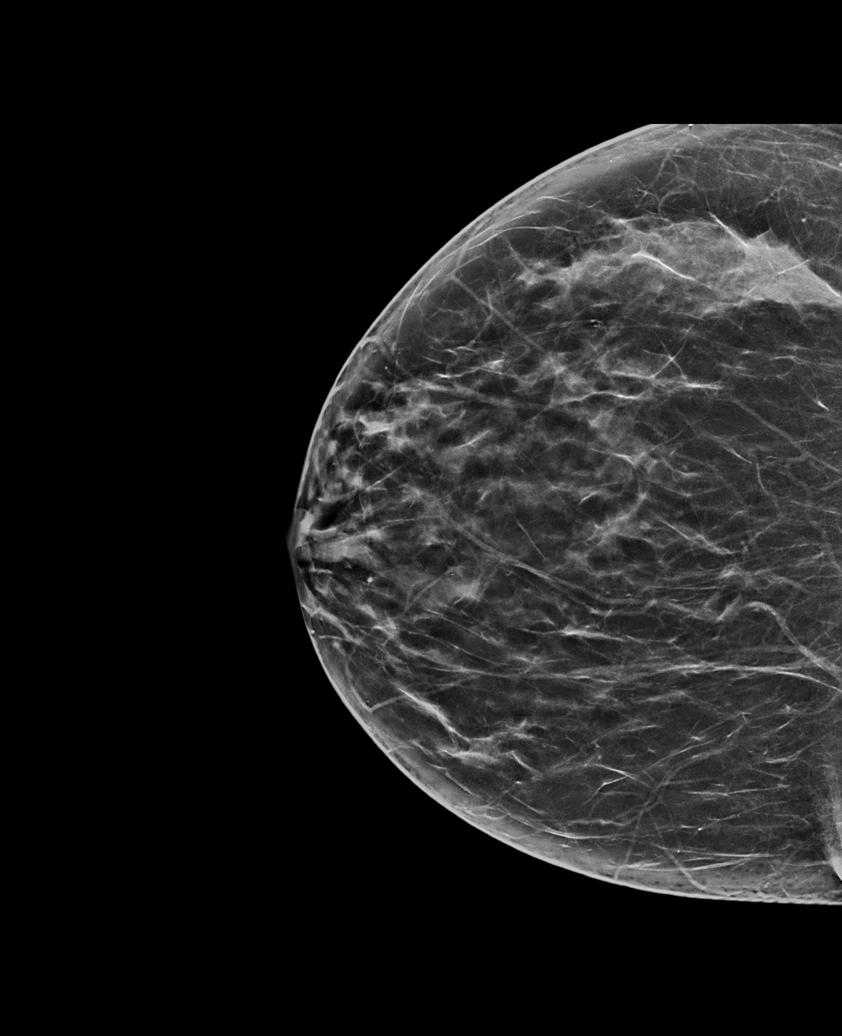

[L CC synth-2D]
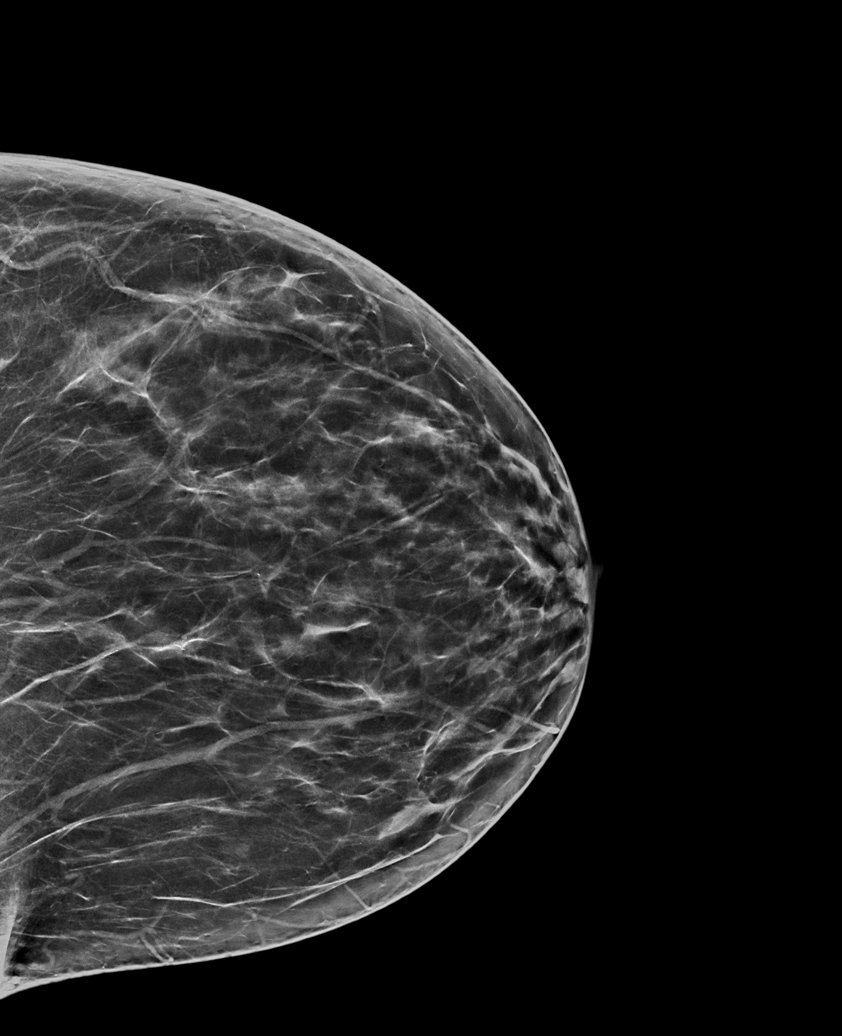

[R MLO synth-2D (1 of 2)]
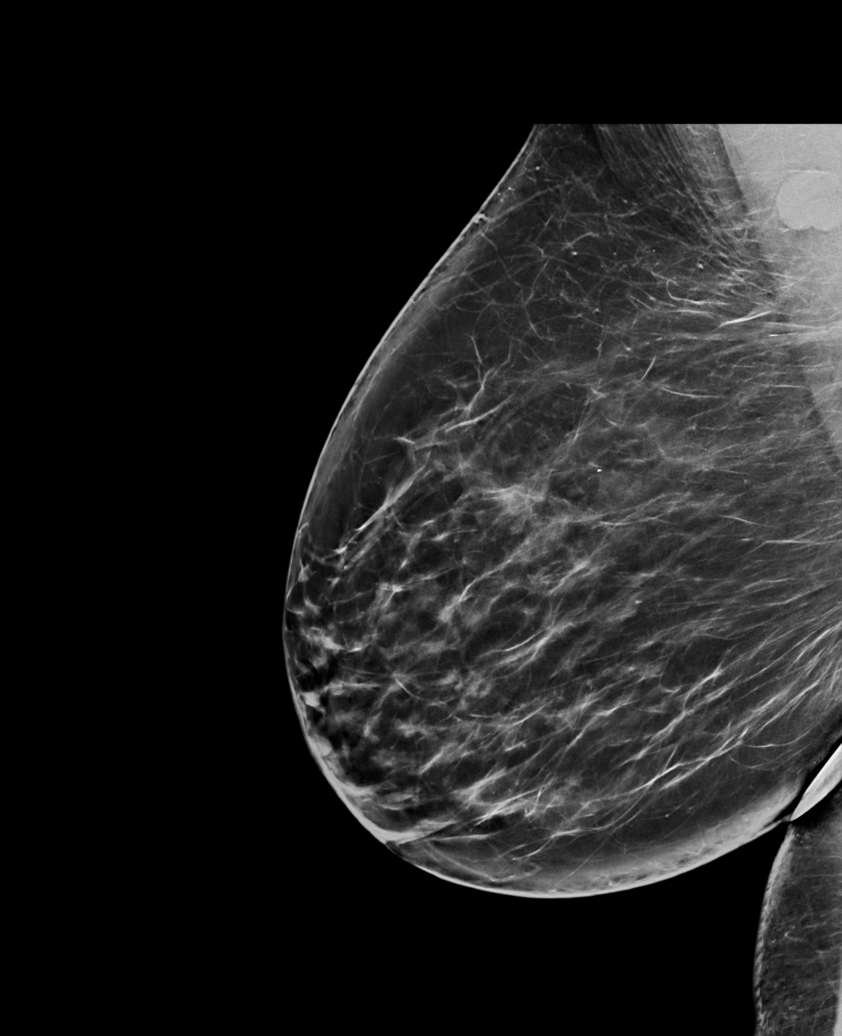

[R MLO synth-2D (2 of 2)]
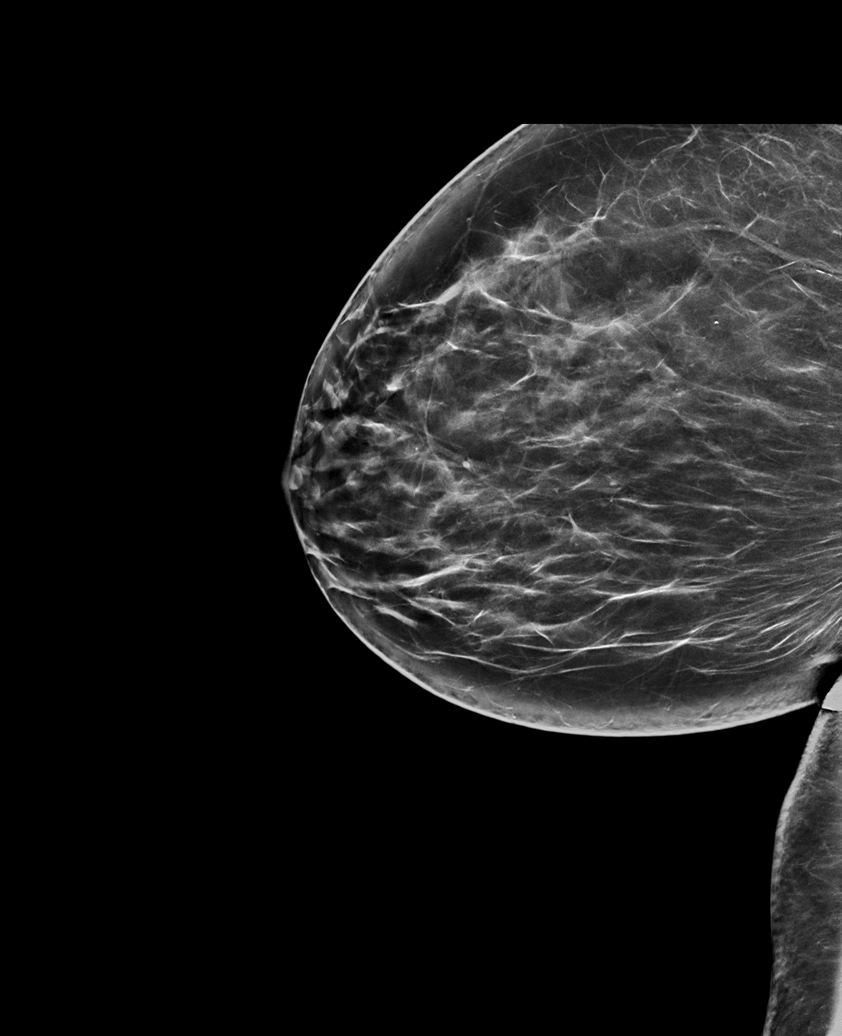

[R MLO tomo · tomo slice 41/81.0]
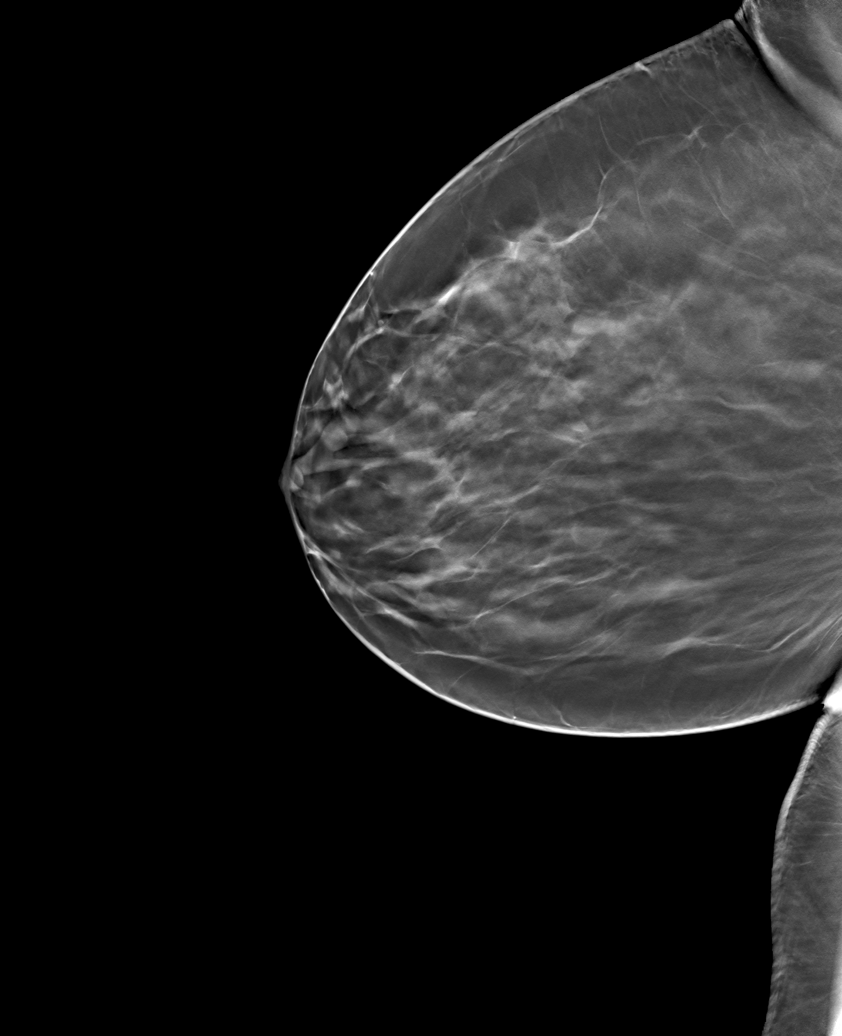

[6 of 30 positions shown; findings below may reference images not displayed]

ACR Breast Density Category b: There are scattered areas of
fibroglandular density.
FINDINGS: There are no findings suspicious for malignancy. Images were
processed with CAD.
IMPRESSION: No mammographic evidence of malignancy. A result letter of this
screening mammogram will be mailed directly to the patient.

RECOMMENDATION:
Screening mammogram in one year. (Code:[id number])

BI-RADS CATEGORY  1: Negative.

## 2021-04-19 ENCOUNTER — Other Ambulatory Visit: Payer: Self-pay

## 2021-04-19 ENCOUNTER — Emergency Department
Admission: EM | Admit: 2021-04-19 | Discharge: 2021-04-19 | Disposition: A | Discharge status: Home / Self Care | Payer: Medicare HMO | Attending: Emergency Medicine | Admitting: Emergency Medicine

## 2021-04-19 DIAGNOSIS — Z7982 Long term (current) use of aspirin: Secondary | ICD-10-CM | POA: Diagnosis not present

## 2021-04-19 DIAGNOSIS — I10 Essential (primary) hypertension: Secondary | ICD-10-CM | POA: Diagnosis not present

## 2021-04-19 DIAGNOSIS — S90861A Insect bite (nonvenomous), right foot, initial encounter: Secondary | ICD-10-CM | POA: Insufficient documentation

## 2021-04-19 DIAGNOSIS — T63481A Toxic effect of venom of other arthropod, accidental (unintentional), initial encounter: Secondary | ICD-10-CM

## 2021-04-19 DIAGNOSIS — Z79899 Other long term (current) drug therapy: Secondary | ICD-10-CM | POA: Diagnosis not present

## 2021-04-19 DIAGNOSIS — J45909 Unspecified asthma, uncomplicated: Secondary | ICD-10-CM | POA: Diagnosis not present

## 2021-04-19 DIAGNOSIS — T7840XA Allergy, unspecified, initial encounter: Secondary | ICD-10-CM | POA: Diagnosis not present

## 2021-04-19 DIAGNOSIS — W57XXXA Bitten or stung by nonvenomous insect and other nonvenomous arthropods, initial encounter: Secondary | ICD-10-CM | POA: Diagnosis not present

## 2021-04-19 MED ORDER — IBUPROFEN 600 MG PO TABS: 600.0000 mg | ORAL_TABLET | Freq: Three times a day (TID) | ORAL | 0 refills | Status: AC | PRN

## 2021-04-19 MED ORDER — PREDNISONE 20 MG PO TABS
60.0000 mg | ORAL_TABLET | Freq: Once | ORAL | Status: AC
  Administered 2021-04-19: 60 mg via ORAL
  Filled 2021-04-19: qty 3

## 2021-04-19 MED ORDER — KETOROLAC TROMETHAMINE 60 MG/2ML IM SOLN
60.0000 mg | Freq: Once | INTRAMUSCULAR | Status: AC
  Administered 2021-04-19: 60 mg via INTRAMUSCULAR
  Filled 2021-04-19: qty 2

## 2021-04-19 MED ORDER — PREDNISONE 20 MG PO TABS: 40.0000 mg | ORAL_TABLET | Freq: Every day | ORAL | 0 refills | Status: AC

## 2021-04-19 MED ORDER — DIPHENHYDRAMINE HCL 25 MG PO TABS: 25.0000 mg | ORAL_TABLET | Freq: Three times a day (TID) | ORAL | 0 refills | Status: AC | PRN

## 2021-04-19 MED ORDER — OXYCODONE-ACETAMINOPHEN 5-325 MG PO TABS
1.0000 | ORAL_TABLET | Freq: Once | ORAL | Status: AC
Start: 2021-04-19 — End: 2021-04-19
  Administered 2021-04-19: 1 via ORAL
  Filled 2021-04-19: qty 1

## 2021-04-19 NOTE — ED Provider Notes (Signed)
[facility] Emergency Department Provider Note  ____________________________________________   Event Date/Time   First MD Initiated Contact with Patient 04/19/21 2052     (approximate)  I have reviewed the triage vital signs and the nursing notes.   HISTORY  Chief Complaint Foot Pain    HPI Elizabeth [name] is a 60 y.o. female  here with foot pain. Pt was walking outside just prior to arrival when she stepped on something or was stung by something on her foot. She felt immediate sharp, stinging pain along the plantar aspect of her foot with associated burning sensation. This lasted <1 minute. Since then, however, she's had worsening swelling, pain, and throbbing of the foot and ankle. H/o similar reactions to insect and wasp stings in the past. No pain prior to the sting. She does not believe it was a snake. No bleeding. Pain worse w/ movement, palpation. No alleviating factors.        Past Medical History:  Diagnosis Date   Asthma    Hypertension    Thyroid disease     Patient Active Problem List   Diagnosis Date Noted   Abnormal Pap smear of cervix 10/09/2020   Low back pain 07/13/2016   Essential hypertension, benign 07/13/2016   Pain in limb 07/13/2016    Past Surgical History:  Procedure Laterality Date   BACK SURGERY     CHOLECYSTECTOMY     NECK SURGERY      Prior to Admission medications   Medication Sig Start Date End Date Taking? Authorizing Provider  diphenhydrAMINE (BENADRYL) 25 MG tablet Take 1 tablet (25 mg total) by mouth every 8 (eight) hours as needed for itching. 04/19/21  Yes Shaune Pollack, MD  ibuprofen (ADVIL) 600 MG tablet Take 1 tablet (600 mg total) by mouth every 8 (eight) hours as needed for moderate pain. 04/19/21  Yes Shaune Pollack, MD  predniSONE (DELTASONE) 20 MG tablet Take 2 tablets (40 mg total) by mouth daily for 5 days. 04/19/21 04/24/21 Yes Shaune Pollack, MD  albuterol (VENTOLIN HFA) 108 (90 Base) MCG/ACT  inhaler Inhale 1-2 puffs into the lungs every 6 (six) hours as needed. 07/09/13   [provider]  aspirin 81 MG chewable tablet Chew 81 mg by mouth daily.    [provider]  atenolol (TENORMIN) 25 MG tablet Take 1 tablet by mouth daily. 10/15/13   [provider]  Baclofen 5 MG TABS Take 5 mg by mouth 3 (three) times daily. 02/20/17   Joni Reining, PA-C  cetirizine (ZYRTEC) 5 MG tablet Take 5 mg by mouth daily.    [provider]  Cholecalciferol 50 MCG (2000 UT) TABS Take 1 tablet by mouth daily.    [provider]  fluticasone (FLONASE) 50 MCG/ACT nasal spray Place 2 sprays into both nostrils as needed. 04/19/11   [provider]  furosemide (LASIX) 20 MG tablet Take by mouth. 11/22/19   [provider]  hydrochlorothiazide (HYDRODIURIL) 25 MG tablet Take 1 tablet by mouth daily. Patient not taking: No sig reported 07/28/13   [provider]  losartan (COZAAR) 50 MG tablet Take 50 mg by mouth daily.    [provider]  sucralfate (CARAFATE) 1 g tablet Take 1 tablet (1 g total) by mouth 4 (four) times daily. 04/18/19   Phineas Semen, MD  vitamin B-12 (CYANOCOBALAMIN) 1000 MCG tablet Take 1,000 mcg by mouth daily.    [provider]    Allergies Lisinopril and Other  Family History  Problem Relation Age of Onset   Congestive Heart Failure Mother    Diabetes Mother    Heart attack Mother    Hypertension Mother    Stroke Mother    Emphysema Father    COPD Father    Hypertension Father    Breast cancer Maternal Aunt        >50   Breast cancer Cousin        maternal 1st cousin    Social History Social History   Tobacco Use   Smoking status: Never   Smokeless tobacco: Never  Vaping Use   Vaping Use: Never used  Substance Use Topics   Alcohol use: No   Drug use: No    Review of Systems  Review of Systems  Constitutional:  Negative for fatigue and fever.  HENT:  Negative for  congestion and sore throat.   Eyes:  Negative for visual disturbance.  Respiratory:  Negative for cough and shortness of breath.   Cardiovascular:  Positive for leg swelling. Negative for chest pain.  Gastrointestinal:  Negative for abdominal pain, diarrhea, nausea and vomiting.  Genitourinary:  Negative for flank pain.  Musculoskeletal:  Positive for arthralgias. Negative for back pain and neck pain.  Skin:  Positive for rash. Negative for wound.  Neurological:  Negative for weakness.  All other systems reviewed and are negative.   ____________________________________________  PHYSICAL EXAM:      VITAL SIGNS: ED Triage Vitals [04/19/21 2017]  Enc Vitals Group     BP (!) 171/86     Pulse Rate 80     Resp 16     Temp 98.7 F (37.1 C)     Temp Source Oral     SpO2 100 %     Weight 196 lb (88.9 kg)     Height 5\' 2"  (1.575 m)     Head Circumference      Peak Flow      Pain Score 10     Pain Loc      Pain Edu?      Excl. in GC?      Physical Exam Vitals and nursing note reviewed.  Constitutional:      General: She is not in acute distress.    Appearance: She is well-developed.  HENT:     Head: Normocephalic and atraumatic.  Eyes:     Conjunctiva/sclera: Conjunctivae normal.  Cardiovascular:     Rate and Rhythm: Normal rate and regular rhythm.     Heart sounds: Normal heart sounds.  Pulmonary:     Effort: Pulmonary effort is normal. No respiratory distress.     Breath sounds: No wheezing.  Abdominal:     General: There is no distension.  Musculoskeletal:     Cervical back: Neck supple.  Skin:    General: Skin is warm.     Capillary Refill: Capillary refill takes less than 2 seconds.     Findings: No rash.  Neurological:     Mental Status: She is alert and oriented to person, place, and time.     Motor: No abnormal muscle tone.     Right foot:  Moderate swelling/edema throughout foot to level of ankle. This is worst along the plantar aspect of MTP joints of  2-3td digits. No visible puncture wound or other marks. No overt erythema. Mild diffuse edema, no fluctuance. 2+ DP and PT pulses.   ____________________________________________   LABS (all labs ordered are listed, but only abnormal results are  displayed)  Labs Reviewed - No data to display  ____________________________________________  EKG:  ________________________________________  RADIOLOGY All imaging, including plain films, CT scans, and ultrasounds, independently reviewed by me, and interpretations confirmed via formal radiology reads.  ED MD interpretation:     Official radiology report(s): No results found.  ____________________________________________  PROCEDURES   Procedure(s) performed (including Critical Care):  Procedures  ____________________________________________  INITIAL IMPRESSION / MDM / ASSESSMENT AND PLAN / ED COURSE  As part of my medical decision making, I reviewed the following data within the electronic MEDICAL RECORD NUMBER Nursing notes reviewed and incorporated, Old chart reviewed, Notes from prior ED visits, and [place] Controlled Substance Database       *Jayliah D Peeples was evaluated in Emergency Department on 04/20/2021 for the symptoms described in the history of present illness. She was evaluated in the context of the global COVID-19 pandemic, which necessitated consideration that the patient might be at risk for infection with the SARS-CoV-2 virus that causes COVID-19. Institutional protocols and algorithms that pertain to the evaluation of patients at risk for COVID-19 are in a state of rapid change based on information released by regulatory bodies including the CDC and federal and state organizations. These policies and algorithms were followed during the patient's care in the ED.  Some ED evaluations and interventions may be delayed as a result of limited staffing during the pandemic.*     Medical Decision Making:  60 yo F here with foot pain.  Suspect exaggerated local allergic reaction to insect sting. Mild edema noted btu no erythema, and time course is not consistent with cellulitis or infection. No open wounds or apparent retained FB. Pt is HDS without other signs of systemic allergic rxn or anaphylaxis. Will place on steroids, antihistamines, d/c with outpt follow-up. Counseled on signs/sx of secondary infection.  ____________________________________________  FINAL CLINICAL IMPRESSION(S) / ED DIAGNOSES  Final diagnoses:  Insect stings, accidental or unintentional, initial encounter  Allergic reaction, initial encounter     MEDICATIONS GIVEN DURING THIS VISIT:  Medications  ketorolac (TORADOL) injection 60 mg (60 mg Intramuscular Given 04/19/21 2147)  oxyCODONE-acetaminophen (PERCOCET/ROXICET) 5-325 MG per tablet 1 tablet (1 tablet Oral Given 04/19/21 2147)  predniSONE (DELTASONE) tablet 60 mg (60 mg Oral Given 04/19/21 2147)     ED Discharge Orders          Ordered    predniSONE (DELTASONE) 20 MG tablet  Daily        04/19/21 2147    ibuprofen (ADVIL) 600 MG tablet  Every 8 hours PRN        04/19/21 2147    diphenhydrAMINE (BENADRYL) 25 MG tablet  Every 8 hours PRN        04/19/21 2147             Note:  This document was prepared using Dragon voice recognition software and may include unintentional dictation errors.   Shaune Pollack, MD 04/20/21 [phone]

## 2021-04-19 NOTE — ED Triage Notes (Signed)
Pt states she was stung by a yellow jacket at 1700 to right foot today. Pt states she has continued pain and swelling to area. Pt appears in no acute distress, slight swelling noted to right foot.

## 2021-04-19 NOTE — Discharge Instructions (Addendum)
Keep the foot elevated for the next 48 hours  Try to minimize walking  Take the steroids and meds as prescribed  Return to ER if redness worsens or persists >48 hours

## 2021-10-08 ENCOUNTER — Other Ambulatory Visit: Payer: Self-pay

## 2021-10-08 ENCOUNTER — Other Ambulatory Visit (HOSPITAL_COMMUNITY)
Admission: RE | Admit: 2021-10-08 | Discharge: 2021-10-08 | Disposition: A | Discharge status: Home / Self Care | Payer: Medicare HMO | Source: Ambulatory Visit | Attending: Obstetrics | Admitting: Obstetrics

## 2021-10-08 ENCOUNTER — Ambulatory Visit (INDEPENDENT_AMBULATORY_CARE_PROVIDER_SITE_OTHER): Payer: Medicare HMO | Admitting: Obstetrics

## 2021-10-08 VITALS — BP 134/84 | Ht 62.0 in | Wt 214.0 lb

## 2021-10-08 DIAGNOSIS — Z124 Encounter for screening for malignant neoplasm of cervix: Secondary | ICD-10-CM

## 2021-10-08 DIAGNOSIS — Z1151 Encounter for screening for human papillomavirus (HPV): Secondary | ICD-10-CM | POA: Diagnosis not present

## 2021-10-08 DIAGNOSIS — Z01419 Encounter for gynecological examination (general) (routine) without abnormal findings: Secondary | ICD-10-CM

## 2021-10-08 NOTE — Progress Notes (Signed)
Gynecology Annual Exam  PCP: Leanna Sato, MD  Chief Complaint:  Chief Complaint  Patient presents with   Annual Exam    History of Present Illness:Patient is a 61 y.o. [name] presents for annual exam. The patient has no complaints today.   LMP: Patient's last menstrual period was 12/30/2017 (approximate). She is postmenopausal. The patient is sexually active. She has dyspareunia.  The patient does not perform self breast exams.  There is no notable family history of breast or ovarian cancer in her family.  The patient wears seatbelts: yes.   The patient has regular exercise: yes.    The patient denies current symptoms of depression.     Review of Systems: Review of Systems  Constitutional: Negative.   HENT: Negative.    Eyes:  Positive for pain and redness.       Managed by PCP.   Respiratory: Negative.    Cardiovascular: Negative.   Gastrointestinal:  Positive for constipation.       Managed by PCP.   Genitourinary: Negative.   Musculoskeletal:  Positive for joint pain.       Managed by PCP.   Skin: Negative.   Neurological:  Positive for tingling.       Managed by PCP.   Endo/Heme/Allergies: Negative.   Psychiatric/Behavioral: Negative.     Past Medical History:  Patient Active Problem List   Diagnosis Date Noted   Abnormal Pap smear of cervix 10/09/2020    10/09/2020 LGSIL pap. No High Risk HPV    Low back pain 07/13/2016   Essential hypertension, benign 07/13/2016   Pain in limb 07/13/2016    Past Surgical History:  Past Surgical History:  Procedure Laterality Date   BACK SURGERY     CHOLECYSTECTOMY     NECK SURGERY      Gynecologic History:  Patient's last menstrual period was 12/30/2017 (approximate). Last Pap: Results were: 10/02/2020  low-grade squamous intraepithelial neoplasia (LGSIL - encompassing HPV,mild dysplasia,CIN I)  Last mammogram: 11/04/2020 Results were: Elby Showers I  Obstetric History: [id number]  Family History:  Family History   Problem Relation Age of Onset   Congestive Heart Failure Mother    Diabetes Mother    Heart attack Mother    Hypertension Mother    Stroke Mother    Emphysema Father    COPD Father    Hypertension Father    Breast cancer Maternal Aunt        >50   Breast cancer Cousin        maternal 1st cousin    Social History:  Social History   Socioeconomic History   Marital status: Single    Spouse name: Not on file   Number of children: Not on file   Years of education: Not on file   Highest education level: Not on file  Occupational History   Not on file  Tobacco Use   Smoking status: Never   Smokeless tobacco: Never  Vaping Use   Vaping Use: Never used  Substance and Sexual Activity   Alcohol use: No   Drug use: No   Sexual activity: Yes    Birth control/protection: Post-menopausal  Other Topics Concern   Not on file  Social History Narrative   Not on file   Social Determinants of Health   Financial Resource Strain: Not on file  Food Insecurity: Not on file  Transportation Needs: Not on file  Physical Activity: Not on file  Stress: Not on file  Social  Connections: Not on file  Intimate Partner Violence: Not on file    Allergies:  Allergies  Allergen Reactions   Lisinopril Swelling   Other Rash    Medications: Prior to Admission medications   Medication Sig Start Date End Date Taking? Authorizing Provider  albuterol (VENTOLIN HFA) 108 (90 Base) MCG/ACT inhaler Inhale 1-2 puffs into the lungs every 6 (six) hours as needed. 07/09/13  Yes [provider]  aspirin 81 MG chewable tablet Chew 81 mg by mouth daily.   Yes [provider]  atenolol (TENORMIN) 25 MG tablet Take 1 tablet by mouth daily. 10/15/13  Yes [provider]  cetirizine (ZYRTEC) 5 MG tablet Take 5 mg by mouth daily.   Yes [provider]  diphenhydrAMINE (BENADRYL) 25 MG tablet Take 1 tablet (25 mg total) by mouth every 8 (eight) hours as needed for itching.  04/19/21  Yes Shaune Pollack, MD  fluticasone (FLONASE) 50 MCG/ACT nasal spray Place 2 sprays into both nostrils as needed. 04/19/11  Yes [provider]  furosemide (LASIX) 20 MG tablet Take by mouth. 11/22/19  Yes [provider]  ibuprofen (ADVIL) 600 MG tablet Take 1 tablet (600 mg total) by mouth every 8 (eight) hours as needed for moderate pain. 04/19/21  Yes Shaune Pollack, MD  losartan (COZAAR) 50 MG tablet Take 50 mg by mouth daily.   Yes [provider]  vitamin B-12 (CYANOCOBALAMIN) 1000 MCG tablet Take 1,000 mcg by mouth daily.   Yes [provider]  Baclofen 5 MG TABS Take 5 mg by mouth 3 (three) times daily. Patient not taking: Reported on 3/Elizabeth/2023 02/20/17   Joni Reining, PA-C  Cholecalciferol 50 MCG (2000 UT) TABS Take 1 tablet by mouth daily. Patient not taking: Reported on 3/Elizabeth/2023    [provider]  hydrochlorothiazide (HYDRODIURIL) 25 MG tablet Take 1 tablet by mouth daily. Patient not taking: Reported on 3/Elizabeth/2023 07/28/13   [provider]  sucralfate (CARAFATE) 1 g tablet Take 1 tablet (1 g total) by mouth 4 (four) times daily. Patient not taking: Reported on 3/Elizabeth/2023 04/18/19   Phineas Semen, MD    Physical Exam Vitals: Blood pressure 134/84, height 5\' 2"  (1.575 m), weight 214 lb (97.1 kg), last menstrual period 12/30/2017.  General: NAD HEENT: normocephalic, anicteric Thyroid: no enlargement, no palpable nodules Pulmonary: No increased work of breathing, CTAB Cardiovascular: RRR, distal pulses 2+ Breast: Breast symmetrical, no tenderness, no palpable nodules or masses, no skin or nipple retraction present, no nipple discharge.  No axillary or supraclavicular lymphadenopathy. Abdomen: NABS, soft, non-tender, non-distended.  Umbilicus without lesions.  No hepatomegaly, splenomegaly or masses palpable. No evidence of hernia  Genitourinary:  External: Normal external female genitalia.  Normal urethral meatus,  normal Bartholin's and Skene's glands.    Vagina: Normal vaginal mucosa, no evidence of prolapse.    Cervix: Grossly normal in appearance, no bleeding  Uterus: Non-enlarged, mobile, normal contour.  No CMT  Adnexa: ovaries non-enlarged, no adnexal masses  Rectal: deferred  Lymphatic: no evidence of inguinal lymphadenopathy Extremities: no edema, erythema, or tenderness Neurologic: Grossly intact Psychiatric: mood appropriate, affect full. PHQ 1 GAD 0   Female chaperone present for pelvic and breast  portions of the physical exam     Assessment: 61 y.o. [id number] routine annual exam  Plan: Problem List Items Addressed This Visit   None Visit Diagnoses     Women's annual routine gynecological examination    -  Primary   Cervical cancer screening  1) Mammogram - recommend yearly screening mammogram.  Mammogram  has been scheduled.   2) STI screening  was offered and declined  3) ASCCP guidelines and rational discussed.  Patient opts for  screening today.     4) Osteoporosis  - per USPTF routine screening DEXA at age 46  Consider FDA-approved medical therapies in postmenopausal women and men aged 76 years and older, based on the following: a) A hip or vertebral (clinical or morphometric) fracture b) T-score ? -2.5 at the femoral neck or spine after appropriate evaluation to exclude secondary causes C) Low bone mass (T-score between -1.0 and -2.5 at the femoral neck or spine) and a 10-year probability of a hip fracture ? 3% or a 10-year probability of a major osteoporosis-related fracture ? 20% based on the [place]-adapted WHO algorithm   5) Routine healthcare maintenance including cholesterol, diabetes screening discussed managed by PCP  6) Colonoscopy per PCP.  Screening recommended starting at age 64 for average risk individuals, age 27 for individuals deemed at increased risk (including African Americans) and recommended to continue until age 61.  For patient age 2-85  individualized approach is recommended.  Gold standard screening is via colonoscopy, Cologuard screening is an acceptable alternative for patient unwilling or unable to undergo colonoscopy.  "Colorectal cancer screening for average?risk adults: 2018 guideline update from the American Cancer Society"CA: A Cancer Journal for Clinicians: Dec 22, 2016   7) Return in about 1 year (around 3/Elizabeth/2024) for annual.  8) Collected pap today. Will communicate results to patient.   9) Discussed previous complaints related to postmenopausal status. Counseled on options, patient will reach out to a GYN provider PRN.   Lamont Snowball, SNM  Mirna Mires, CNM  3/Elizabeth/2023 2:06 PM   Lauralee Evener [facility] 3/Elizabeth/2023, 2:06 PM

## 2021-10-13 LAB — CYTOLOGY - PAP
Adequacy: ABSENT
Comment: NEGATIVE
High risk HPV: NEGATIVE

## 2021-10-15 ENCOUNTER — Other Ambulatory Visit: Payer: Self-pay | Admitting: Obstetrics

## 2021-10-15 ENCOUNTER — Encounter: Payer: Self-pay | Admitting: Obstetrics

## 2021-10-15 DIAGNOSIS — R87612 Low grade squamous intraepithelial lesion on cytologic smear of cervix (LGSIL): Secondary | ICD-10-CM

## 2021-11-06 ENCOUNTER — Telehealth: Payer: Self-pay

## 2021-11-06 NOTE — Telephone Encounter (Signed)
Pt calling wanting to speak with MMF about her pap results.. does she need to have repeat in one year?  ?

## 2021-11-10 ENCOUNTER — Telehealth: Payer: Self-pay

## 2021-11-10 NOTE — Telephone Encounter (Signed)
Pt calling for the results of her pap smear; doesn't understand how to use MyChart. [phone] ?

## 2021-11-13 NOTE — Telephone Encounter (Signed)
Please let me know when pt calls back. Phone went straight to VM and mail box not set up  ?

## 2021-11-20 NOTE — Telephone Encounter (Signed)
Pt aware of MMF's letter; tx'd to AM to schedule colposcopy. ?

## 2021-12-11 ENCOUNTER — Other Ambulatory Visit (HOSPITAL_COMMUNITY)
Admission: RE | Admit: 2021-12-11 | Discharge: 2021-12-11 | Disposition: A | Discharge status: Home / Self Care | Payer: Medicare HMO | Source: Ambulatory Visit | Attending: Family Medicine | Admitting: Family Medicine

## 2021-12-11 ENCOUNTER — Ambulatory Visit (INDEPENDENT_AMBULATORY_CARE_PROVIDER_SITE_OTHER): Payer: Medicare HMO | Admitting: Family Medicine

## 2021-12-11 VITALS — BP 134/80 | Ht 62.0 in | Wt 212.0 lb

## 2021-12-11 DIAGNOSIS — R87612 Low grade squamous intraepithelial lesion on cytologic smear of cervix (LGSIL): Secondary | ICD-10-CM | POA: Insufficient documentation

## 2021-12-11 DIAGNOSIS — N393 Stress incontinence (female) (male): Secondary | ICD-10-CM | POA: Diagnosis not present

## 2021-12-11 NOTE — Progress Notes (Signed)
    GYNECOLOGY OFFICE COLPOSCOPY PROCEDURE NOTE  61 y.o. [id number] here for colposcopy for low-grade squamous intraepithelial neoplasia (LGSIL - encompassing HPV,mild dysplasia,CIN I) HPV NEG pap smear on 10/08/21.   She had an LSIL, HPV neg in 2022 and has a repeat pap in 1 year, no colpo.   Discussed role for HPV in cervical dysplasia, need for surveillance.  Patient gave informed written consent, time out was performed.  Placed in lithotomy position. Cervix viewed with speculum and colposcope after application of acetic acid.   Colposcopy adequate? Yes Acetowhite lesion(s) noted at 8 o'clock mand 4 oclock and abnormal vessels noted at 8 o'clock; corresponding biopsies obtained.  ECC specimen obtained.  Physical Exam Exam conducted with a chaperone present.  Genitourinary:    Exam position: Lithotomy position.     Labia:        Right: No rash.        Left: No rash.      Vagina: Normal.       All specimens were labeled and sent to pathology.  Chaperone was present during entire procedure.  Patient was given post procedure instructions.  Will follow up pathology and manage accordingly; patient will be contacted with results and recommendations.  Routine preventative health maintenance measures emphasized.  1. Low grade squamous intraepithelial lesion on cytologic smear of cervix (LGSIL) - Surgical pathology( [facility]/ [facility])  2. Stress incontinence Reviewed her stress incontinence after patient disclosed she wears pads daily for urinary leakage. Patient with mild prolapse but SVD x 8. Likely weak pelvic floor. Accepted referral to PT.  - Ambulatory referral to Physical Therapy  Federico Flake, MD, MPH, ABFM, [facility] Attending Physician Center for [facility]

## 2021-12-14 LAB — SURGICAL PATHOLOGY

## 2021-12-16 ENCOUNTER — Telehealth: Payer: Self-pay

## 2021-12-16 NOTE — Telephone Encounter (Signed)
Called pt to f/u on pap results per Dr Alvester Morin, no answer, [facility].

## 2021-12-16 NOTE — Progress Notes (Signed)
Called pt, no answer, [facility]. 

## 2021-12-22 ENCOUNTER — Encounter: Payer: Self-pay | Admitting: Family Medicine

## 2021-12-22 ENCOUNTER — Ambulatory Visit (INDEPENDENT_AMBULATORY_CARE_PROVIDER_SITE_OTHER): Payer: Medicare HMO | Admitting: Family Medicine

## 2021-12-22 VITALS — BP 126/84 | Ht 62.0 in | Wt 212.0 lb

## 2021-12-22 DIAGNOSIS — N870 Mild cervical dysplasia: Secondary | ICD-10-CM | POA: Diagnosis not present

## 2021-12-22 NOTE — Progress Notes (Signed)
   GYNECOLOGY PROBLEM  VISIT ENCOUNTER NOTE  Subjective:   Elizabeth [name] is a 61 y.o. [id number] female here for a problem GYN visit.  Current complaints: here to discuss her pap results and next steps.   Denies abnormal vaginal bleeding, discharge, pelvic pain, problems with intercourse or other gynecologic concerns.    Gynecologic History Patient's last menstrual period was 12/30/2017 (approximate).  Contraception: post menopausal status  Health Maintenance Due  Topic Date Due   HIV Screening  Never done   Hepatitis C Screening  Never done   COLONOSCOPY (Pts 45-78yrs Insurance coverage will need to be confirmed)  Never done   Zoster Vaccines- Shingrix (1 of 2) Never done   COVID-19 Vaccine (3 - Booster for Moderna series) 03/04/2020   TETANUS/TDAP  03/17/2021    The following portions of the patient's history were reviewed and updated as appropriate: allergies, current medications, past family history, past medical history, past social history, past surgical history and problem list.  Review of Systems Pertinent items are noted in HPI.   Objective:  BP 126/84   Ht 5\' 2"  (1.575 m)   Wt 212 lb (96.2 kg)   LMP 12/30/2017 (Approximate)   BMI 38.78 kg/m  Gen: well appearing, NAD HEENT: no scleral icterus CV: RR Lung: Normal WOB Ext: warm well perfused   Assessment and Plan:   1. Dysplasia of cervix, low grade (CIN 1) Reviewed options in detail Discussed recommendation for LEEP given LSIL x 2 years and CIN on colpo this year. Other options presented were pursing hystrectomy though I voiced this was extreme and not my recommendation or repeat pap in 1 year which is reasonable but would mean patient accepts the risk of progression to HSIL or more severe changes.   Used visuals to help patient understand procedure and reason for LEEP Reviewed procedure and anesthesia Patient is worried about pain/anxious. Discussed premedication with Ativan and percocet as long as she has  someone to drive. Patient is interested in this She would like 1 week to think about what she wants to do.  Again reiterated my recommendation is to have LEEP  Please refer to After Visit Summary for other counseling recommendations.   Return if LEEP desired.  Face to face time:  20 minutes  Greater than 50% of the visit time was spent in counseling and coordination of care with the patient.  The summary and outline of the counseling and care coordination is summarized in the note above.   All questions were answered.  03/01/2018, MD, MPH, ABFM Attending Physician Faculty Practice- Center for [facility]

## 2022-01-15 ENCOUNTER — Other Ambulatory Visit: Payer: Self-pay | Admitting: Family Medicine

## 2022-01-15 DIAGNOSIS — Z1231 Encounter for screening mammogram for malignant neoplasm of breast: Secondary | ICD-10-CM

## 2022-03-03 ENCOUNTER — Ambulatory Visit
Admission: RE | Admit: 2022-03-03 | Discharge: 2022-03-03 | Disposition: A | Discharge status: Home / Self Care | Payer: Medicare HMO | Source: Ambulatory Visit | Attending: Family Medicine | Admitting: Family Medicine

## 2022-03-03 DIAGNOSIS — Z1231 Encounter for screening mammogram for malignant neoplasm of breast: Secondary | ICD-10-CM | POA: Insufficient documentation

## 2022-03-08 ENCOUNTER — Other Ambulatory Visit: Payer: Self-pay | Admitting: Family Medicine

## 2022-03-08 DIAGNOSIS — R928 Other abnormal and inconclusive findings on diagnostic imaging of breast: Secondary | ICD-10-CM

## 2022-03-08 DIAGNOSIS — N6489 Other specified disorders of breast: Secondary | ICD-10-CM

## 2022-03-08 DIAGNOSIS — N63 Unspecified lump in unspecified breast: Secondary | ICD-10-CM

## 2022-03-26 ENCOUNTER — Inpatient Hospital Stay: Admission: RE | Admit: 2022-03-26 | Payer: Medicare HMO | Source: Ambulatory Visit

## 2022-03-26 ENCOUNTER — Other Ambulatory Visit: Payer: Medicare HMO

## 2022-04-15 ENCOUNTER — Ambulatory Visit
Admission: RE | Admit: 2022-04-15 | Discharge: 2022-04-15 | Disposition: A | Discharge status: Home / Self Care | Payer: Medicare HMO | Source: Ambulatory Visit | Attending: Family Medicine | Admitting: Family Medicine

## 2022-04-15 DIAGNOSIS — R928 Other abnormal and inconclusive findings on diagnostic imaging of breast: Secondary | ICD-10-CM | POA: Insufficient documentation

## 2022-04-15 DIAGNOSIS — N63 Unspecified lump in unspecified breast: Secondary | ICD-10-CM | POA: Diagnosis present

## 2022-04-15 DIAGNOSIS — N6489 Other specified disorders of breast: Secondary | ICD-10-CM | POA: Diagnosis present

## 2022-10-25 ENCOUNTER — Other Ambulatory Visit: Payer: Self-pay | Admitting: Family Medicine

## 2022-10-25 DIAGNOSIS — N63 Unspecified lump in unspecified breast: Secondary | ICD-10-CM

## 2022-11-04 ENCOUNTER — Emergency Department
Admission: EM | Admit: 2022-11-04 | Discharge: 2022-11-04 | Disposition: A | Discharge status: Home / Self Care | Payer: Medicare HMO | Attending: Emergency Medicine | Admitting: Emergency Medicine

## 2022-11-04 ENCOUNTER — Emergency Department: Payer: Medicare HMO

## 2022-11-04 ENCOUNTER — Other Ambulatory Visit: Payer: Self-pay

## 2022-11-04 DIAGNOSIS — M79605 Pain in left leg: Secondary | ICD-10-CM

## 2022-11-04 DIAGNOSIS — I10 Essential (primary) hypertension: Secondary | ICD-10-CM | POA: Diagnosis not present

## 2022-11-04 DIAGNOSIS — Z79899 Other long term (current) drug therapy: Secondary | ICD-10-CM | POA: Diagnosis not present

## 2022-11-04 DIAGNOSIS — Z7951 Long term (current) use of inhaled steroids: Secondary | ICD-10-CM | POA: Diagnosis not present

## 2022-11-04 DIAGNOSIS — Z7982 Long term (current) use of aspirin: Secondary | ICD-10-CM | POA: Diagnosis not present

## 2022-11-04 DIAGNOSIS — J45909 Unspecified asthma, uncomplicated: Secondary | ICD-10-CM | POA: Insufficient documentation

## 2022-11-04 LAB — BASIC METABOLIC PANEL
Anion gap: 9 (ref 5–15)
BUN: 15 mg/dL (ref 6–20)
CO2: 26 mmol/L (ref 22–32)
Calcium: 9 mg/dL (ref 8.9–10.3)
Chloride: 105 mmol/L (ref 98–111)
Creatinine, Ser: 0.73 mg/dL (ref 0.44–1.00)
GFR, Estimated: >60 mL/min (ref >60)
Glucose, Bld: 99 mg/dL (ref 70–99)
Potassium: 3.8 mmol/L (ref 3.5–5.1)
Sodium: 140 mmol/L (ref 135–145)

## 2022-11-04 LAB — CK: Total CK: 168 U/L (ref 38–234)

## 2022-11-04 MED ORDER — CYCLOBENZAPRINE HCL 5 MG PO TABS: ORAL_TABLET | ORAL | 0 refills | Status: DC

## 2022-11-04 MED ORDER — CYCLOBENZAPRINE HCL 10 MG PO TABS
5.0000 mg | ORAL_TABLET | Freq: Once | ORAL | Status: AC
  Administered 2022-11-04: 5 mg via ORAL
  Filled 2022-11-04: qty 1

## 2022-11-04 MED ORDER — KETOROLAC TROMETHAMINE 30 MG/ML IJ SOLN
15.0000 mg | Freq: Once | INTRAMUSCULAR | Status: AC
  Administered 2022-11-04: 15 mg via INTRAVENOUS
  Filled 2022-11-04: qty 1

## 2022-11-04 MED ORDER — MELOXICAM 7.5 MG PO TABS: 7.5000 mg | ORAL_TABLET | Freq: Every day | ORAL | 0 refills | Status: AC

## 2022-11-04 NOTE — ED Triage Notes (Signed)
Pt presents to ER with c/o left leg pain that she states has been going on for 2-3 weeks, but became very bad tonight.  Pt states pain is mostly in the back of her leg and radiates down her leg.  Pt denies any new injuries to her leg.  Pt is otherwise A&O x4 and ambulatory on request.

## 2022-11-04 NOTE — ED Provider Notes (Signed)
[facility] Provider Note    Event Date/Time   First MD Initiated Contact with Patient 11/04/22 [phone]     (approximate)   History   Leg Pain   HPI  Elizabeth [name] is a 62 y.o. female who presents to the ED for from home with a chief complaint of nontraumatic left leg pain x 2 to 3 weeks.  Patient works at [employer], standing on her feet for long hours of the day.  Reports fall in September.  Complains of pain behind her left knee radiating down her leg for the past 2 to 3 weeks.  Symptoms associated with muscle spasms.  Symptoms worse tonight.  Denies associated chest pain, shortness of breath, travel or OCP use.     Past Medical History   Past Medical History:  Diagnosis Date   Asthma    Hypertension    Thyroid disease      Active Problem List   Patient Active Problem List   Diagnosis Date Noted   Abnormal Pap smear of cervix 10/09/2020   Low back pain 07/13/2016   Essential hypertension, benign 07/13/2016   Pain in limb 07/13/2016     Past Surgical History   Past Surgical History:  Procedure Laterality Date   BACK SURGERY     CHOLECYSTECTOMY     NECK SURGERY       Home Medications   Prior to Admission medications   Medication Sig Start Date End Date Taking? Authorizing Provider  cyclobenzaprine (FLEXERIL) 5 MG tablet 1 tablet every 8 hours as needed for muscle spasms 11/04/22  Yes Irean Hong, MD  meloxicam (MOBIC) 7.5 MG tablet Take 1 tablet (7.5 mg total) by mouth daily for 7 days. 11/04/22 11/11/22 Yes Irean Hong, MD  albuterol (VENTOLIN HFA) 108 (90 Base) MCG/ACT inhaler Inhale 1-2 puffs into the lungs every 6 (six) hours as needed. 07/09/13   [provider]  aspirin 81 MG chewable tablet Chew 81 mg by mouth daily.    [provider]  atenolol (TENORMIN) 25 MG tablet Take 1 tablet by mouth daily. 10/15/13   [provider]  Baclofen 5 MG TABS Take 5 mg by mouth 3 (three) times daily. Patient not  taking: Reported on 10/08/2021 02/20/17   Joni Reining, PA-C  cetirizine (ZYRTEC) 5 MG tablet Take 5 mg by mouth daily.    [provider]  Cholecalciferol 50 MCG (2000 UT) TABS Take 1 tablet by mouth daily. Patient not taking: Reported on 10/08/2021    [provider]  diphenhydrAMINE (BENADRYL) 25 MG tablet Take 1 tablet (25 mg total) by mouth every 8 (eight) hours as needed for itching. 04/19/21   Shaune Pollack, MD  fluticasone (FLONASE) 50 MCG/ACT nasal spray Place 2 sprays into both nostrils as needed. 04/19/11   [provider]  furosemide (LASIX) 20 MG tablet Take by mouth. 11/22/19   [provider]  hydrochlorothiazide (HYDRODIURIL) 25 MG tablet Take 1 tablet by mouth daily. Patient not taking: Reported on 10/08/2021 07/28/13   [provider]  ibuprofen (ADVIL) 600 MG tablet Take 1 tablet (600 mg total) by mouth every 8 (eight) hours as needed for moderate pain. 04/19/21   Shaune Pollack, MD  losartan (COZAAR) 50 MG tablet Take 50 mg by mouth daily.    [provider]  sucralfate (CARAFATE) 1 g tablet Take 1 tablet (1 g total) by mouth 4 (four) times daily. Patient not taking: Reported on 10/08/2021 04/18/19  Phineas SemenGoodman, Gray[place], MD  vitamin B-12 (CYANOCOBALAMIN) 1000 MCG tablet Take 1,000 mcg by mouth daily.    [provider]     Allergies  Lisinopril and Other   Family History   Family History  Problem Relation Age of Onset   Congestive Heart Failure Mother    Diabetes Mother    Heart attack Mother    Hypertension Mother    Stroke Mother    Emphysema Father    COPD Father    Hypertension Father    Breast cancer Maternal Aunt        >50   Breast cancer Cousin        maternal 1st cousin     Physical Exam  Triage Vital Signs: ED Triage Vitals  Enc Vitals Group     BP 11/04/22 0533 (!) 179/96     Pulse Rate 11/04/22 0533 72     Resp 11/04/22 0533 17     Temp 11/04/22 0533 97.9 F (36.6 C)     Temp Source  11/04/22 0533 Oral     SpO2 11/04/22 0533 95 %     Weight 11/04/22 0534 202 lb (91.6 kg)     Height 11/04/22 0534 5\' 2"  (1.575 m)     Head Circumference --      Peak Flow --      Pain Score 11/04/22 0533 10     Pain Loc --      Pain Edu? --      Excl. in GC? --     Updated Vital Signs: BP (!) 179/96 (BP Location: Left Arm)   Pulse 72   Temp 97.9 F (36.6 C) (Oral)   Resp 17   Ht 5\' 2"  (1.575 m)   Wt 91.6 kg   LMP 12/30/2017 (Approximate)   SpO2 95%   BMI 36.95 kg/m    General: Awake, no distress.  CV:  Good peripheral perfusion.  Resp:  Normal effort.  Abd:  No distention.  Other:  Left leg: 2+ femoral distal pulses.  Mild tenderness to palpation behind left knee.  Full range of motion left knee without pain.  Calf is supple and mildly tender.  Brisk, less than 5-second cap refill.   ED Results / Procedures / Treatments  Labs (all labs ordered are listed, but only abnormal results are displayed) Labs Reviewed  BASIC METABOLIC PANEL  CK     EKG  None   RADIOLOGY I have independently visualized and interpreted patient's ultrasound as well as noted the radiology interpretation:  DVT ultrasound: Negative  Official radiology report(s): US Venous Img Lower Unilateral Left (DVT)  Result Date: 11/04/2022 CLINICAL DATA:  Calf pain EXAM: LEFT LOWER EXTREMITY VENOUS DOPPLER ULTRASOUND TECHNIQUE: Gray-scale sonography with compression, as well as color and duplex ultrasound, were performed to evaluate the deep venous system(s) from the level of the common femoral vein through the popliteal and proximal calf veins. COMPARISON:  06/25/2016 FINDINGS: VENOUS Normal compressibility of the common femoral, superficial femoral, and popliteal veins, as well as the visualized calf veins. Visualized portions of profunda femoral vein and great saphenous vein unremarkable. No filling defects to suggest DVT on grayscale or color Doppler imaging. Doppler waveforms show normal direction of  venous flow, normal respiratory plasticity and response to augmentation. Limited views of the contralateral common femoral vein are unremarkable. IMPRESSION: Negative for DVT in the left lower extremity Electronically Signed   By: Tiburcio PeaJonathan  Watts M.D.   On: 11/04/2022 06:26     PROCEDURES:  Critical Care performed: No  Procedures   MEDICATIONS ORDERED IN ED: Medications  ketorolac (TORADOL) 30 MG/ML injection 15 mg (15 mg Intravenous Given 11/04/22 0619)  cyclobenzaprine (FLEXERIL) tablet 5 mg (5 mg Oral Given 11/04/22 5929)     IMPRESSION / MDM / ASSESSMENT AND PLAN / ED COURSE  I reviewed the triage vital signs and the nursing notes.                             62 year old female presenting with nontraumatic left leg pain.  Will check electrolytes, CK, DVT ultrasound.  Administer Ketorolac for pain paired with Flexeril for muscle spasms.  Will reassess.  Patient's presentation is most consistent with acute complicated illness / injury requiring diagnostic workup.  [id number] Updated patient on negative DVT ultrasound.  She is resting comfortably in no acute distress.  She is pending results of lab work.  Care will be transferred to the oncoming provider at change of shift.  Anticipate patient will be able to be discharged home on NSAIDs and muscle relaxers.  FINAL CLINICAL IMPRESSION(S) / ED DIAGNOSES   Final diagnoses:  Left leg pain     Rx / DC Orders   ED Discharge Orders          Ordered    meloxicam (MOBIC) 7.5 MG tablet  Daily        11/04/22 0627    cyclobenzaprine (FLEXERIL) 5 MG tablet        11/04/22 2863             Note:  This document was prepared using Dragon voice recognition software and may include unintentional dictation errors.   Irean Hong, MD 11/04/22 [phone]

## 2022-11-04 NOTE — Discharge Instructions (Signed)
You may take medicines as needed for pain and muscle spasms.  Apply moist heat to affected area several times daily.  Return to the ER for worsening symptoms, persistent vomiting, difficulty breathing or other concerns. 

## 2022-11-08 ENCOUNTER — Encounter: Payer: Self-pay | Admitting: Family Medicine

## 2022-11-12 ENCOUNTER — Other Ambulatory Visit: Payer: Self-pay | Admitting: Family Medicine

## 2022-11-12 DIAGNOSIS — N63 Unspecified lump in unspecified breast: Secondary | ICD-10-CM

## 2022-11-25 ENCOUNTER — Ambulatory Visit
Admission: RE | Admit: 2022-11-25 | Discharge: 2022-11-25 | Disposition: A | Discharge status: Home / Self Care | Payer: Medicare HMO | Source: Ambulatory Visit | Attending: Family Medicine | Admitting: Family Medicine

## 2022-11-25 DIAGNOSIS — R92323 Mammographic fibroglandular density, bilateral breasts: Secondary | ICD-10-CM | POA: Insufficient documentation

## 2022-11-25 DIAGNOSIS — N6320 Unspecified lump in the left breast, unspecified quadrant: Secondary | ICD-10-CM | POA: Insufficient documentation

## 2022-11-25 DIAGNOSIS — N63 Unspecified lump in unspecified breast: Secondary | ICD-10-CM

## 2022-11-25 DIAGNOSIS — Z1239 Encounter for other screening for malignant neoplasm of breast: Secondary | ICD-10-CM | POA: Diagnosis not present

## 2022-12-02 ENCOUNTER — Telehealth: Payer: Self-pay

## 2022-12-02 DIAGNOSIS — Z86010 Personal history of colonic polyps: Secondary | ICD-10-CM

## 2022-12-02 NOTE — Telephone Encounter (Signed)
Gastroenterology Pre-Procedure Review Patient to call back to schedule she has to check with her employer about getting time off work.  Letter mailed. Request Date: TBD Requesting Physician: Dr. Jodelle Gross  PATIENT REVIEW QUESTIONS: The patient responded to the following health history questions as indicated:    1. Are you having any GI issues? no 2. Do you have a personal history of Polyps? yes (referral noted history of colon polyps) 3. Do you have a family history of Colon Cancer or Polyps? no 4. Diabetes Mellitus? no 5. Joint replacements in the past 12 months?no 6. Major health problems in the past 3 months?no 7. Any artificial heart valves, MVP, or defibrillator?no    MEDICATIONS & ALLERGIES:    Patient reports the following regarding taking any anticoagulation/antiplatelet therapy:   Plavix, Coumadin, Eliquis, Xarelto, Lovenox, Pradaxa, Brilinta, or Effient? no Aspirin? 81 mg daily  Patient confirms/reports the following medications:  Current Outpatient Medications  Medication Sig Dispense Refill   albuterol (VENTOLIN HFA) 108 (90 Base) MCG/ACT inhaler Inhale 1-2 puffs into the lungs every 6 (six) hours as needed.     aspirin 81 MG chewable tablet Chew 81 mg by mouth daily.     atenolol (TENORMIN) 25 MG tablet Take 1 tablet by mouth daily.     Baclofen 5 MG TABS Take 5 mg by mouth 3 (three) times daily. (Patient not taking: Reported on 10/08/2021) 15 tablet 0   cetirizine (ZYRTEC) 5 MG tablet Take 5 mg by mouth daily.     Cholecalciferol 50 MCG (2000 UT) TABS Take 1 tablet by mouth daily. (Patient not taking: Reported on 10/08/2021)     cyclobenzaprine (FLEXERIL) 5 MG tablet 1 tablet every 8 hours as needed for muscle spasms 15 tablet 0   diphenhydrAMINE (BENADRYL) 25 MG tablet Take 1 tablet (25 mg total) by mouth every 8 (eight) hours as needed for itching. 20 tablet 0   fluticasone (FLONASE) 50 MCG/ACT nasal spray Place 2 sprays into both nostrils as needed.     furosemide (LASIX) 20  MG tablet Take by mouth.     hydrochlorothiazide (HYDRODIURIL) 25 MG tablet Take 1 tablet by mouth daily. (Patient not taking: Reported on 10/08/2021)     ibuprofen (ADVIL) 600 MG tablet Take 1 tablet (600 mg total) by mouth every 8 (eight) hours as needed for moderate pain. 20 tablet 0   losartan (COZAAR) 50 MG tablet Take 50 mg by mouth daily.     sucralfate (CARAFATE) 1 g tablet Take 1 tablet (1 g total) by mouth 4 (four) times daily. (Patient not taking: Reported on 10/08/2021) 60 tablet 0   vitamin B-12 (CYANOCOBALAMIN) 1000 MCG tablet Take 1,000 mcg by mouth daily.     No current facility-administered medications for this visit.    Patient confirms/reports the following allergies:  Allergies  Allergen Reactions   Lisinopril Swelling   Other Rash    No orders of the defined types were placed in this encounter.   AUTHORIZATION INFORMATION Primary Insurance: 1D#: Group #:  Secondary Insurance: 1D#: Group #:  SCHEDULE INFORMATION: Date:  Time: Location:

## 2023-01-20 ENCOUNTER — Telehealth: Payer: Self-pay

## 2023-01-20 NOTE — Telephone Encounter (Signed)
Pt left vmm to schedule appointment please return call

## 2023-01-20 NOTE — Telephone Encounter (Signed)
Spoken to patient regarding scheduling a colonoscopy. Patient is unable to schedule because she will be scheduling for knee surgery in August.  Inform patient that we will need to hold off on scheduling for her recovery. Will set a reminder.

## 2023-03-31 ENCOUNTER — Other Ambulatory Visit: Payer: Self-pay | Admitting: Physician Assistant

## 2023-03-31 ENCOUNTER — Ambulatory Visit: Payer: Medicare HMO

## 2023-03-31 DIAGNOSIS — R2242 Localized swelling, mass and lump, left lower limb: Secondary | ICD-10-CM

## 2023-04-01 ENCOUNTER — Ambulatory Visit
Admission: RE | Admit: 2023-04-01 | Discharge: 2023-04-01 | Disposition: A | Discharge status: Home / Self Care | Payer: Medicare HMO | Source: Ambulatory Visit | Attending: Physician Assistant | Admitting: Physician Assistant

## 2023-04-01 DIAGNOSIS — R2242 Localized swelling, mass and lump, left lower limb: Secondary | ICD-10-CM | POA: Insufficient documentation

## 2023-05-13 ENCOUNTER — Other Ambulatory Visit: Payer: Self-pay | Admitting: Family Medicine

## 2023-05-13 DIAGNOSIS — N63 Unspecified lump in unspecified breast: Secondary | ICD-10-CM

## 2023-05-31 ENCOUNTER — Other Ambulatory Visit: Payer: Medicare HMO

## 2023-06-21 ENCOUNTER — Ambulatory Visit
Admission: RE | Admit: 2023-06-21 | Discharge: 2023-06-21 | Disposition: A | Discharge status: Home / Self Care | Payer: Medicare HMO | Source: Ambulatory Visit | Attending: Family Medicine | Admitting: Family Medicine

## 2023-06-21 DIAGNOSIS — N63 Unspecified lump in unspecified breast: Secondary | ICD-10-CM

## 2023-06-21 DIAGNOSIS — N6322 Unspecified lump in the left breast, upper inner quadrant: Secondary | ICD-10-CM | POA: Insufficient documentation

## 2023-06-21 DIAGNOSIS — N641 Fat necrosis of breast: Secondary | ICD-10-CM | POA: Diagnosis not present

## 2023-08-03 ENCOUNTER — Encounter: Payer: Self-pay | Admitting: *Deleted

## 2024-06-08 ENCOUNTER — Encounter

## 2024-07-30 ENCOUNTER — Encounter: Payer: Self-pay | Admitting: Family Medicine

## 2024-08-02 ENCOUNTER — Other Ambulatory Visit: Payer: Self-pay

## 2024-08-02 ENCOUNTER — Emergency Department
Admission: EM | Admit: 2024-08-02 | Discharge: 2024-08-02 | Disposition: A | Discharge status: Home / Self Care | Attending: Emergency Medicine | Admitting: Emergency Medicine

## 2024-08-02 ENCOUNTER — Emergency Department

## 2024-08-02 DIAGNOSIS — I10 Essential (primary) hypertension: Secondary | ICD-10-CM | POA: Insufficient documentation

## 2024-08-02 DIAGNOSIS — R0789 Other chest pain: Secondary | ICD-10-CM | POA: Diagnosis present

## 2024-08-02 LAB — CBC
HCT: 43.3 % (ref 36.0–46.0)
Hemoglobin: 14 g/dL (ref 12.0–15.0)
MCH: 29 pg (ref 26.0–34.0)
MCHC: 32.3 g/dL (ref 30.0–36.0)
MCV: 89.6 fL (ref 80.0–100.0)
Platelets: 236 K/uL (ref 150–400)
RBC: 4.83 MIL/uL (ref 3.87–5.11)
RDW: 13.1 % (ref 11.5–15.5)
WBC: 6.5 K/uL (ref 4.0–10.5)
nRBC: 0 % (ref 0.0–0.2)

## 2024-08-02 LAB — BASIC METABOLIC PANEL WITH GFR
Anion gap: 11 (ref 5–15)
BUN: 11 mg/dL (ref 8–23)
CO2: 28 mmol/L (ref 22–32)
Calcium: 10.2 mg/dL (ref 8.9–10.3)
Chloride: 100 mmol/L (ref 98–111)
Creatinine, Ser: 0.74 mg/dL (ref 0.44–1.00)
GFR, Estimated: >60 mL/min
Glucose, Bld: 93 mg/dL (ref 70–99)
Potassium: 4 mmol/L (ref 3.5–5.1)
Sodium: 138 mmol/L (ref 135–145)

## 2024-08-02 LAB — TROPONIN T, HIGH SENSITIVITY
Troponin T High Sensitivity: <15 ng/L (ref 0–19)
Troponin T High Sensitivity: <15 ng/L (ref 0–19)

## 2024-08-02 MED ORDER — TRAMADOL HCL 50 MG PO TABS
50.0000 mg | ORAL_TABLET | Freq: Once | ORAL | Status: AC
  Administered 2024-08-02: 50 mg via ORAL
  Filled 2024-08-02: qty 1

## 2024-08-02 MED ORDER — TRAMADOL HCL 50 MG PO TABS: 50.0000 mg | ORAL_TABLET | Freq: Four times a day (QID) | ORAL | 0 refills | Status: AC | PRN

## 2024-08-02 MED ORDER — CYCLOBENZAPRINE HCL 10 MG PO TABS: 10.0000 mg | ORAL_TABLET | Freq: Three times a day (TID) | ORAL | 0 refills | Status: AC | PRN

## 2024-08-02 NOTE — ED Triage Notes (Signed)
 Pt comes with cp that started yesterday and worse today. Pt states mid sternal cp. Pt states no sob.  Pt states pain in left shoulder raided down.

## 2024-08-02 NOTE — Discharge Instructions (Signed)
Return to the ER for new, worsening, or persistent severe chest pain, difficulty breathing, weakness or lightheadedness, or any other new or worsening symptoms that concern you. 

## 2024-08-02 NOTE — ED Provider Notes (Signed)
 "  [facility] Provider Note    Event Date/Time   First MD Initiated Contact with Patient 08/02/24 1544     (approximate)   History   Chest Pain   HPI  Daliah D Haymer is a 64 y.o. female with a history of hypertension who presents with chest pain, acute onset about 3 days ago.  The patient states that she was moving boxes at her job at [employer] about 4 or 5 days ago when a box fell onto her chest.  She did not think anything of it initially, but then the pain started over the next day or 2.  Since that time she has had intermittent pain which is worse with certain movements and changes in position.  She has taken Tylenol  and baclofen  for with minimal relief.  She denies associated shortness of breath although does hurt when she takes a very deep breath.  She denies feeling dizzy or lightheaded.  She has no cough or fever.  I reviewed the past medical records.  The patient's most recent outpatient encounter was on 10/6 of last year with urinary symptoms at [facility].  She has no recent ED visits or hospitalizations.   Physical Exam   Triage Vital Signs: ED Triage Vitals  Encounter Vitals Group     BP 08/02/24 1237 (!) 163/88     Girls Systolic BP Percentile --      Girls Diastolic BP Percentile --      Boys Systolic BP Percentile --      Boys Diastolic BP Percentile --      Pulse Rate 08/02/24 1237 62     Resp 08/02/24 1237 18     Temp 08/02/24 1237 98.5 F (36.9 C)     Temp src --      SpO2 08/02/24 1237 97 %     Weight 08/02/24 1234 200 lb (90.7 kg)     Height 08/02/24 1234 5' 1 (1.549 m)     Head Circumference --      Peak Flow --      Pain Score 08/02/24 1234 6     Pain Loc --      Pain Education --      Exclude from Growth Chart --     Most recent vital signs: Vitals:   08/02/24 1628 08/02/24 1642  BP:  (!) 141/80  Pulse:  60  Resp:  18  Temp:  (!) 97.4 F (36.3 C)  SpO2: 97% 97%     General: Alert, well-appearing,  no distress.  CV:  Good peripheral perfusion.  Normal heart sounds. Resp:  Normal effort.  Lungs CTAB. Abd:  No distention.  Other:  No peripheral edema.  Left and central chest wall tenderness, reproducing the pain.   ED Results / Procedures / Treatments   Labs (all labs ordered are listed, but only abnormal results are displayed) Labs Reviewed  BASIC METABOLIC PANEL WITH GFR  CBC  TROPONIN T, HIGH SENSITIVITY  TROPONIN T, HIGH SENSITIVITY     EKG  ED ECG REPORT I, Waylon Cassis, the attending physician, personally viewed and interpreted this ECG.  Date: 08/02/2024 EKG Time: 1235 Rate: 61 Rhythm: normal sinus rhythm QRS Axis: normal Intervals: normal ST/T Wave abnormalities: normal Narrative Interpretation: no evidence of acute ischemia    RADIOLOGY  Chest x-ray: I independently viewed and interpreted the images; there is no focal consolidation or edema  PROCEDURES:  Critical Care performed: No  Procedures  MEDICATIONS ORDERED IN ED: Medications  traMADol  (ULTRAM ) tablet 50 mg (50 mg Oral Given 08/02/24 1638)     IMPRESSION / MDM / ASSESSMENT AND PLAN / ED COURSE  I reviewed the triage vital signs and the nursing notes.  64 year old female with PMH as noted above presents with atypical, reproducible chest pain after a box fell onto her a few days ago.  EKG is nonischemic.  Differential diagnosis includes, but is not limited to, costochondritis, contusion, other musculoskeletal chest wall pain, less likely ACS.  There is no evidence of aortic dissection or other vascular cause.  The presentation is not consistent with PE.  BMP and CBC are unremarkable.  Troponins are negative x 2.  Chest x-ray is clear.  Patient's presentation is most consistent with acute complicated illness / injury requiring diagnostic workup.  At this time, based on the negative workup, there is no indication for further ED evaluation and the patient is stable for discharge.  She  feels comfortable going home.  She has a history of GI problems and would like to avoid NSAIDs so I have prescribed a small quantity of tramadol  as well as Flexeril .  I counseled her on the results of the workup and likely etiologies of her symptoms and answered all of her questions.  I gave strict return precautions, she expressed understanding.  FINAL CLINICAL IMPRESSION(S) / ED DIAGNOSES   Final diagnoses:  Musculoskeletal chest pain     Rx / DC Orders   ED Discharge Orders          Ordered    traMADol  (ULTRAM ) 50 MG tablet  Every 6 hours PRN        08/02/24 1633    cyclobenzaprine  (FLEXERIL ) 10 MG tablet  3 times daily PRN        08/02/24 1633             Note:  This document was prepared using Dragon voice recognition software and may include unintentional dictation errors.    Jacolyn Pae, MD 08/02/24 1821  "

## 2024-08-03 ENCOUNTER — Other Ambulatory Visit: Payer: Self-pay | Admitting: Family Medicine

## 2024-08-03 DIAGNOSIS — N63 Unspecified lump in unspecified breast: Secondary | ICD-10-CM

## 2024-08-07 ENCOUNTER — Emergency Department
Admission: EM | Admit: 2024-08-07 | Discharge: 2024-08-07 | Disposition: A | Discharge status: Home / Self Care | Attending: Emergency Medicine | Admitting: Emergency Medicine

## 2024-08-07 ENCOUNTER — Other Ambulatory Visit: Payer: Self-pay

## 2024-08-07 DIAGNOSIS — M436 Torticollis: Secondary | ICD-10-CM | POA: Insufficient documentation

## 2024-08-07 DIAGNOSIS — M542 Cervicalgia: Secondary | ICD-10-CM | POA: Diagnosis present

## 2024-08-07 NOTE — ED Provider Notes (Signed)
 "  [facility] Provider Note    Event Date/Time   First MD Initiated Contact with Patient 08/07/24 1211     (approximate)   History   Torticollis   HPI  Elizabeth [name] is a 64 y.o. female who presents today for evaluation of neck stiffness for the past 3 to 4 days.  She reports that she caught a box on her chest and came to the emergency department at that time, reports that she feels significantly improved from that standpoint, though now reports that she has tightness in her neck.  She feels that she is having difficulty turning her head side-to-side.  She reports the base of her head hurts.  She has not had any vomiting.  No numbness or tingling or weakness in her extremities.  She has muscle relaxants that were given to her during her previous ER visit, does not feel that they are working.  No headache, fever, or light sensitivity.  Patient Active Problem List   Diagnosis Date Noted   Abnormal Pap smear of cervix 10/09/2020   Low back pain 07/13/2016   Essential hypertension, benign 07/13/2016   Pain in limb 07/13/2016          Physical Exam   Triage Vital Signs: ED Triage Vitals  Encounter Vitals Group     BP 08/07/24 1150 (!) 170/83     Girls Systolic BP Percentile --      Girls Diastolic BP Percentile --      Boys Systolic BP Percentile --      Boys Diastolic BP Percentile --      Pulse Rate 08/07/24 1150 66     Resp 08/07/24 1150 16     Temp 08/07/24 1150 98.6 F (37 C)     Temp Source 08/07/24 1150 Oral     SpO2 08/07/24 1150 97 %     Weight 08/07/24 1151 202 lb (91.6 kg)     Height 08/07/24 1151 5' 1 (1.549 m)     Head Circumference --      Peak Flow --      Pain Score 08/07/24 1151 8     Pain Loc --      Pain Education --      Exclude from Growth Chart --     Most recent vital signs: Vitals:   08/07/24 1150  BP: (!) 170/83  Pulse: 66  Resp: 16  Temp: 98.6 F (37 C)  SpO2: 97%    Physical Exam Vitals and nursing note  reviewed.  Constitutional:      General: Awake and alert. No acute distress.    Appearance: Normal appearance. The patient is normal weight.  HENT:     Head: Normocephalic and atraumatic.     Mouth: Mucous membranes are moist.  Eyes:     General: PERRL. Normal EOMs        Right eye: No discharge.        Left eye: No discharge.     Conjunctiva/sclera: Conjunctivae normal.  Cardiovascular:     Rate and Rhythm: Normal rate and regular rhythm.     Pulses: Normal pulses.  Pulmonary:     Effort: Pulmonary effort is normal. No respiratory distress.     Breath sounds: Normal breath sounds.  Abdominal:     Abdomen is soft. There is no abdominal tenderness. No rebound or guarding. No distention. Musculoskeletal:        General: No swelling. Normal range of motion.  Cervical back: Muscle tightness with range of motion and neck turning head left to right.  Neck is supple. No midline cervical spine tenderness.  Full range of motion of neck.  Negative Spurling test.  Negative Lhermitte sign.  Normal strength and sensation in bilateral upper extremities. Normal grip strength bilaterally.  Normal intrinsic muscle function of the hand bilaterally.  Normal radial pulses bilaterally.  Tenderness to bilateral trapezius muscles with tenderness to insertion site at base of skull. Skin:    General: Skin is warm and dry.     Capillary Refill: Capillary refill takes less than 2 seconds.     Findings: No rash.  Neurological:     Mental Status: The patient is awake and alert.   Neurological: GCS 15 alert and oriented x3 Normal speech, no expressive or receptive aphasia or dysarthria Cranial nerves II through XII intact Normal visual fields 5 out of 5 strength in all 4 extremities with intact sensation throughout No extremity drift Normal finger-to-nose testing, no limb or truncal ataxia    ED Results / Procedures / Treatments   Labs (all labs ordered are listed, but only abnormal results are  displayed) Labs Reviewed - No data to display   EKG     RADIOLOGY     PROCEDURES:  Critical Care performed:   Procedures   MEDICATIONS ORDERED IN ED: Medications - No data to display   IMPRESSION / MDM / ASSESSMENT AND PLAN / ED COURSE  I reviewed the triage vital signs and the nursing notes.   Differential diagnosis includes, but is not limited to, the cause, muscle spasm, muscle strain, less likely cervical radiculopathy  I reviewed the patient's chart.  Patient was seen in the emergency department 5 days ago when a box fell at her chest at work.  Blood work and imaging at that time was reassuring.  Patient has normal strength and sensation of bilateral upper extremities today, normal grip strength bilaterally, no midline cervical spine tenderness, not consistent with cervical spine fracture or central cord syndrome.  She has tenderness to her bilateral trapezius muscle area as well as the insertion site at the base of the skull.  Exam is most consistent with musculoskeletal etiology.  No headache, fever to suggest meningitis.  No sick symptoms.  Discussed with her the option of imaging, however patient declined.  Discussed further analgesia, however patient declined.  We discussed symptomatic management and return precautions.  Patient or stands and agrees with plan.  Discharged in stable condition.   Patient's presentation is most consistent with acute complicated illness / injury requiring diagnostic workup.   FINAL CLINICAL IMPRESSION(S) / ED DIAGNOSES   Final diagnoses:  Torticollis     Rx / DC Orders   ED Discharge Orders     None        Note:  This document was prepared using Dragon voice recognition software and may include unintentional dictation errors.   Lachlan Pelto E, PA-C 08/07/24 1313    Bradler, Evan K, MD 08/07/24 1531  "

## 2024-08-07 NOTE — Discharge Instructions (Signed)
 You may continue to take your muscle relaxants as prescribed.  Please return for any new, worsening, or changing symptoms or other concerns.  It was a pleasure caring for you today.

## 2024-08-07 NOTE — ED Triage Notes (Signed)
 Pt states that she started with a stiff neck Saturday, states that the more she moves the worse it gets, states that she has been using ice and heat and muscle rubs without relief

## 2024-08-14 ENCOUNTER — Inpatient Hospital Stay: Admission: RE | Admit: 2024-08-14 | Source: Ambulatory Visit

## 2024-08-14 ENCOUNTER — Other Ambulatory Visit

## 2024-09-12 ENCOUNTER — Encounter

## 2024-09-12 ENCOUNTER — Other Ambulatory Visit
